# Patient Record
Sex: Male | Born: 1990 | Race: Asian | Hispanic: No | Marital: Single | Smoking: Former smoker
Health system: Southern US, Community
[De-identification: ages and names within clinical notes are randomized; demographics above are authoritative.]

## PROBLEM LIST (undated history)

## (undated) DIAGNOSIS — F1910 Other psychoactive substance abuse, uncomplicated: Secondary | ICD-10-CM

## (undated) DIAGNOSIS — I10 Essential (primary) hypertension: Secondary | ICD-10-CM

## (undated) DIAGNOSIS — Z87442 Personal history of urinary calculi: Secondary | ICD-10-CM

## (undated) DIAGNOSIS — F419 Anxiety disorder, unspecified: Secondary | ICD-10-CM

## (undated) DIAGNOSIS — F32A Depression, unspecified: Secondary | ICD-10-CM

## (undated) HISTORY — PX: TUMOR EXCISION: SHX421

## (undated) HISTORY — DX: Essential (primary) hypertension: I10

## (undated) HISTORY — DX: Depression, unspecified: F32.A

## (undated) HISTORY — DX: Anxiety disorder, unspecified: F41.9

## (undated) HISTORY — DX: Other psychoactive substance abuse, uncomplicated: F19.10

## (undated) HISTORY — DX: Personal history of urinary calculi: Z87.442

---
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
## 2019-07-19 ENCOUNTER — Encounter: Payer: Self-pay | Admitting: Emergency Medicine

## 2019-07-19 ENCOUNTER — Emergency Department: Payer: BC Managed Care – PPO

## 2019-07-19 ENCOUNTER — Emergency Department
Admission: EM | Admit: 2019-07-19 | Discharge: 2019-07-19 | Disposition: A | Discharge status: Home / Self Care | Payer: BC Managed Care – PPO | Attending: Emergency Medicine | Admitting: Emergency Medicine

## 2019-07-19 ENCOUNTER — Other Ambulatory Visit: Payer: Self-pay

## 2019-07-19 DIAGNOSIS — N130 Hydronephrosis with ureteropelvic junction obstruction: Secondary | ICD-10-CM | POA: Insufficient documentation

## 2019-07-19 DIAGNOSIS — N200 Calculus of kidney: Secondary | ICD-10-CM | POA: Diagnosis not present

## 2019-07-19 DIAGNOSIS — F17210 Nicotine dependence, cigarettes, uncomplicated: Secondary | ICD-10-CM | POA: Diagnosis not present

## 2019-07-19 DIAGNOSIS — R1032 Left lower quadrant pain: Secondary | ICD-10-CM | POA: Diagnosis not present

## 2019-07-19 LAB — COMPREHENSIVE METABOLIC PANEL
ALT: 15 U/L (ref 0–44)
AST: 25 U/L (ref 15–41)
Albumin: 4.7 g/dL (ref 3.5–5.0)
Alkaline Phosphatase: 81 U/L (ref 38–126)
Anion gap: 15 (ref 5–15)
BUN: 13 mg/dL (ref 6–20)
CO2: 21 mmol/L — ABNORMAL LOW (ref 22–32)
Calcium: 9.7 mg/dL (ref 8.9–10.3)
Chloride: 105 mmol/L (ref 98–111)
Creatinine, Ser: 1.43 mg/dL — ABNORMAL HIGH (ref 0.61–1.24)
GFR calc Af Amer: >60 mL/min (ref >60)
GFR calc non Af Amer: >60 mL/min (ref >60)
Glucose, Bld: 118 mg/dL — ABNORMAL HIGH (ref 70–99)
Potassium: 3.8 mmol/L (ref 3.5–5.1)
Sodium: 141 mmol/L (ref 135–145)
Total Bilirubin: 0.5 mg/dL (ref 0.3–1.2)
Total Protein: 8.4 g/dL — ABNORMAL HIGH (ref 6.5–8.1)

## 2019-07-19 LAB — CBC
HCT: 44.8 % (ref 39.0–52.0)
Hemoglobin: 14.7 g/dL (ref 13.0–17.0)
MCH: 24.9 pg — ABNORMAL LOW (ref 26.0–34.0)
MCHC: 32.8 g/dL (ref 30.0–36.0)
MCV: 75.8 fL — ABNORMAL LOW (ref 80.0–100.0)
Platelets: 379 10*3/uL (ref 150–400)
RBC: 5.91 MIL/uL — ABNORMAL HIGH (ref 4.22–5.81)
RDW: 12.6 % (ref 11.5–15.5)
WBC: 17.9 10*3/uL — ABNORMAL HIGH (ref 4.0–10.5)
nRBC: 0 % (ref 0.0–0.2)

## 2019-07-19 LAB — URINALYSIS, COMPLETE (UACMP) WITH MICROSCOPIC
Bacteria, UA: NONE SEEN
Bilirubin Urine: NEGATIVE
Glucose, UA: NEGATIVE mg/dL
Ketones, ur: 5 mg/dL — AB
Leukocytes,Ua: NEGATIVE
Nitrite: NEGATIVE
Protein, ur: 30 mg/dL — AB
RBC / HPF: >50 RBC/hpf — ABNORMAL HIGH (ref 0–5)
Specific Gravity, Urine: 1.024 (ref 1.005–1.030)
Squamous Epithelial / HPF: NONE SEEN (ref 0–5)
pH: 7 (ref 5.0–8.0)

## 2019-07-19 LAB — LIPASE, BLOOD: Lipase: 34 U/L (ref 11–51)

## 2019-07-19 MED ORDER — OXYCODONE-ACETAMINOPHEN 5-325 MG PO TABS
1.0000 | ORAL_TABLET | Freq: Once | ORAL | Status: AC
  Administered 2019-07-19: 1 via ORAL
  Filled 2019-07-19: qty 1

## 2019-07-19 MED ORDER — IBUPROFEN 600 MG PO TABS: 600.0000 mg | ORAL_TABLET | Freq: Four times a day (QID) | ORAL | 0 refills | Status: DC | PRN

## 2019-07-19 MED ORDER — KETOROLAC TROMETHAMINE 30 MG/ML IJ SOLN
30.0000 mg | Freq: Once | INTRAMUSCULAR | Status: AC
  Administered 2019-07-19: 30 mg via INTRAVENOUS
  Filled 2019-07-19: qty 1

## 2019-07-19 MED ORDER — ONDANSETRON HCL 4 MG/2ML IJ SOLN
4.0000 mg | Freq: Once | INTRAMUSCULAR | Status: AC | PRN
  Administered 2019-07-19: 4 mg via INTRAVENOUS
  Filled 2019-07-19: qty 2

## 2019-07-19 MED ORDER — FENTANYL CITRATE (PF) 100 MCG/2ML IJ SOLN
50.0000 ug | INTRAMUSCULAR | Status: AC | PRN
  Administered 2019-07-19 (×2): 50 ug via INTRAVENOUS
  Filled 2019-07-19 (×2): qty 2

## 2019-07-19 MED ORDER — OXYCODONE-ACETAMINOPHEN 5-325 MG PO TABS: 1.0000 | ORAL_TABLET | ORAL | 0 refills | Status: DC | PRN

## 2019-07-19 MED ORDER — SODIUM CHLORIDE 0.9 % IV BOLUS
1000.0000 mL | Freq: Once | INTRAVENOUS | Status: AC
  Administered 2019-07-19: 1000 mL via INTRAVENOUS

## 2019-07-19 MED ORDER — MORPHINE SULFATE (PF) 4 MG/ML IV SOLN
4.0000 mg | Freq: Once | INTRAVENOUS | Status: AC
  Administered 2019-07-19: 16:00:00 4 mg via INTRAVENOUS
  Filled 2019-07-19: qty 1

## 2019-07-19 MED ORDER — OXYCODONE-ACETAMINOPHEN 5-325 MG PO TABS: 1.0000 | ORAL_TABLET | ORAL | 0 refills | Status: AC | PRN

## 2019-07-19 NOTE — ED Provider Notes (Signed)
[facility] Emergency Department Provider Note ____________________________________________   First MD Initiated Contact with Patient 07/19/19 1541     (approximate)  I have reviewed the triage vital signs and the nursing notes.   HISTORY  Chief Complaint Abdominal Pain and Flank Pain    HPI Jeremiah Cameron is a 28 y.o. male with no significant PMH who presents with left flank pain, acute onset this morning, severe intensity, and radiating to the left lower abdomen.  He denies any nausea or vomiting.  He has not had any similar pain in the past.  He has no fever or diarrhea.  He denies any urinary symptoms.  History reviewed. No pertinent past medical history.  There are no active problems to display for this patient.   History reviewed. No pertinent surgical history.  Prior to Admission medications   Medication Sig Start Date End Date Taking? Authorizing Provider  ibuprofen (ADVIL) 600 MG tablet Take 1 tablet (600 mg total) by mouth every 6 (six) hours as needed. 07/19/19   Arta Silence, MD  oxyCODONE-acetaminophen (PERCOCET) 5-325 MG tablet Take 1 tablet by mouth every 4 (four) hours as needed for up to 3 days for severe pain. 07/19/19 07/22/19  Arta Silence, MD    Allergies Patient has no known allergies.  No family history on file.  Social History Social History   Tobacco Use  . Smoking status: Current Every Day Smoker    Packs/day: 0.50    Types: Cigarettes  . Smokeless tobacco: Never Used  Substance Use Topics  . Alcohol use: Not on file  . Drug use: Not on file    Review of Systems  Constitutional: No fever. Eyes: No redness. ENT: No sore throat. Cardiovascular: Denies chest pain. Respiratory: Denies shortness of breath. Gastrointestinal: No vomiting. Genitourinary: Negative for dysuria.  Positive for flank pain. Musculoskeletal: Negative for back pain. Skin: Negative for rash. Neurological: Negative for  headache.   ____________________________________________   PHYSICAL EXAM:  VITAL SIGNS: ED Triage Vitals  Enc Vitals Group     BP 07/19/19 1217 (!) 138/48     Pulse Rate 07/19/19 1217 75     Resp 07/19/19 1217 18     Temp 07/19/19 1217 97.9 F (36.6 C)     Temp Source 07/19/19 1217 Oral     SpO2 07/19/19 1217 100 %     Weight 07/19/19 1218 230 lb (104.3 kg)     Height 07/19/19 1218 6' (1.829 m)     Head Circumference --      Peak Flow --      Pain Score 07/19/19 1219 10     Pain Loc --      Pain Edu? --      Excl. in [place]? --     Constitutional: Alert and oriented.  Uncomfortable but not acutely ill-appearing; in no acute distress. Eyes: Conjunctivae are normal.  Head: Atraumatic. Nose: No congestion/rhinnorhea. Mouth/Throat: Mucous membranes are moist.   Neck: Normal range of motion.  Cardiovascular: Good peripheral circulation. Respiratory: Normal respiratory effort.  No retractions.  Gastrointestinal: Soft and nontender. No distention.  Genitourinary: No flank tenderness. Musculoskeletal: Extremities warm and well perfused.  Neurologic:  Normal speech and language. No gross focal neurologic deficits are appreciated.  Skin:  Skin is warm and dry. No rash noted. Psychiatric: Mood and affect are normal. Speech and behavior are normal.  ____________________________________________   LABS (all labs ordered are listed, but only abnormal results are displayed)  Labs Reviewed  COMPREHENSIVE METABOLIC PANEL - Abnormal; Notable for the following components:      Result Value   CO2 21 (*)    Glucose, Bld 118 (*)    Creatinine, Ser 1.43 (*)    Total Protein 8.4 (*)    All other components within normal limits  CBC - Abnormal; Notable for the following components:   WBC 17.9 (*)    RBC 5.91 (*)    MCV 75.8 (*)    MCH 24.9 (*)    All other components within normal limits  URINALYSIS, COMPLETE (UACMP) WITH MICROSCOPIC - Abnormal; Notable for the following components:    Color, Urine YELLOW (*)    APPearance CLEAR (*)    Hgb urine dipstick MODERATE (*)    Ketones, ur 5 (*)    Protein, ur 30 (*)    RBC / HPF >50 (*)    All other components within normal limits  LIPASE, BLOOD   ____________________________________________  EKG   ____________________________________________  RADIOLOGY  CT abdomen: 4 mm ureteral stone at the left UVJ, with mild to moderate hydronephrosis.  Possible mild colitis of distal colon versus collapsed appearance.  ____________________________________________   PROCEDURES  Procedure(s) performed: No  Procedures  Critical Care performed: No ____________________________________________   INITIAL IMPRESSION / ASSESSMENT AND PLAN / ED COURSE  Pertinent labs & imaging results that were available during my care of the patient were reviewed by me and considered in my medical decision making (see chart for details).  28 year old male with no significant PMH presents with acute onset of left flank pain today with no vomiting, fever, or significant urinary symptoms.  On exam, patient is uncomfortable but overall well appearing.  His vital signs are normal.  He does not have any significant abdominal or flank tenderness.  The remainder of the exam is unremarkable.  Overall presentation is consistent with ureteral stone.  CT shows a 4 mm stone at the left UVJ with mild to moderate hydronephrosis.  There is also a collapsed or possibly mildly inflamed appearance of the distal colon, and the patient does have an elevated WBC count.  However, he has no other symptoms to suggest colitis given the acute onset of the pain and the lack of GI symptoms.  I suspect that the WBC count is more of a stress reaction than representing any type of infectious process.  ----------------------------------------- 5:23 PM on 07/19/2019 -----------------------------------------  The patient's pain is well controlled after morphine and Toradol.   He has received a liter of fluids.  She is feeling much better and is comfortable to go home.  He states the pain started to come back a little bit but is much less severe.  I will give a dose of Percocet prior to discharge.  I discussed the CT results with the patient.  Given that he has no GI symptoms and no clinical evidence of colitis, we will not start antibiotics or any specific treatment for this.  However, he is aware to return if he starts to develop diarrhea, fever, or other GI symptoms.  I gave him return precautions for kidney stone as well, and he expressed understanding.  ____________________________________________   FINAL CLINICAL IMPRESSION(S) / ED DIAGNOSES  Final diagnoses:  Kidney stone      NEW MEDICATIONS STARTED DURING THIS VISIT:  New Prescriptions   IBUPROFEN (ADVIL) 600 MG TABLET    Take 1 tablet (600 mg total) by mouth every 6 (six) hours as needed.   OXYCODONE-ACETAMINOPHEN (PERCOCET) 5-325 MG TABLET  Take 1 tablet by mouth every 4 (four) hours as needed for up to 3 days for severe pain.     Note:  This document was prepared using Dragon voice recognition software and may include unintentional dictation errors.    Dionne Bucy, MD 07/19/19 1724

## 2019-07-19 NOTE — Discharge Instructions (Addendum)
Take the ibuprofen as prescribed up to every 6 hours, and the Percocet only if needed for more severe pain.  Return to the ER for new, worsening, or persistent severe left-sided pain, fever, vomiting, diarrhea, blood in the stool, weakness, or any other new or worsening symptoms that concern you.

## 2019-07-19 NOTE — ED Triage Notes (Signed)
Pt arrived via POV with reports of left flank pain and left lower abdominal pain that started this morning. On arrival to ED pt is diaphoretic and writhing around in wheelchair.  Pt denies any N/V. Denies hx of kidney stones, denies any dysuria or difficulty urinating.

## 2019-07-19 NOTE — ED Notes (Signed)
MD at bedside. 

## 2019-07-21 ENCOUNTER — Emergency Department
Admission: EM | Admit: 2019-07-21 | Discharge: 2019-07-21 | Disposition: A | Discharge status: Home / Self Care | Payer: BC Managed Care – PPO | Attending: Emergency Medicine | Admitting: Emergency Medicine

## 2019-07-21 ENCOUNTER — Other Ambulatory Visit: Payer: Self-pay

## 2019-07-21 ENCOUNTER — Encounter: Payer: Self-pay | Admitting: Emergency Medicine

## 2019-07-21 DIAGNOSIS — N201 Calculus of ureter: Secondary | ICD-10-CM | POA: Diagnosis not present

## 2019-07-21 DIAGNOSIS — N200 Calculus of kidney: Secondary | ICD-10-CM | POA: Diagnosis not present

## 2019-07-21 DIAGNOSIS — F17210 Nicotine dependence, cigarettes, uncomplicated: Secondary | ICD-10-CM | POA: Insufficient documentation

## 2019-07-21 DIAGNOSIS — Z79899 Other long term (current) drug therapy: Secondary | ICD-10-CM | POA: Insufficient documentation

## 2019-07-21 DIAGNOSIS — R109 Unspecified abdominal pain: Secondary | ICD-10-CM | POA: Diagnosis not present

## 2019-07-21 LAB — URINALYSIS, COMPLETE (UACMP) WITH MICROSCOPIC
Bacteria, UA: NONE SEEN
Bilirubin Urine: NEGATIVE
Glucose, UA: NEGATIVE mg/dL
Ketones, ur: NEGATIVE mg/dL
Leukocytes,Ua: NEGATIVE
Nitrite: NEGATIVE
Protein, ur: NEGATIVE mg/dL
RBC / HPF: >50 RBC/hpf — ABNORMAL HIGH (ref 0–5)
Specific Gravity, Urine: 1.015 (ref 1.005–1.030)
Squamous Epithelial / HPF: NONE SEEN (ref 0–5)
pH: 5 (ref 5.0–8.0)

## 2019-07-21 LAB — CBC
HCT: 42.5 % (ref 39.0–52.0)
Hemoglobin: 14.1 g/dL (ref 13.0–17.0)
MCH: 25.3 pg — ABNORMAL LOW (ref 26.0–34.0)
MCHC: 33.2 g/dL (ref 30.0–36.0)
MCV: 76.3 fL — ABNORMAL LOW (ref 80.0–100.0)
Platelets: 336 10*3/uL (ref 150–400)
RBC: 5.57 MIL/uL (ref 4.22–5.81)
RDW: 12.6 % (ref 11.5–15.5)
WBC: 9.4 10*3/uL (ref 4.0–10.5)
nRBC: 0 % (ref 0.0–0.2)

## 2019-07-21 LAB — COMPREHENSIVE METABOLIC PANEL
ALT: 12 U/L (ref 0–44)
AST: 18 U/L (ref 15–41)
Albumin: 4.4 g/dL (ref 3.5–5.0)
Alkaline Phosphatase: 72 U/L (ref 38–126)
Anion gap: 10 (ref 5–15)
BUN: 11 mg/dL (ref 6–20)
CO2: 25 mmol/L (ref 22–32)
Calcium: 9.1 mg/dL (ref 8.9–10.3)
Chloride: 104 mmol/L (ref 98–111)
Creatinine, Ser: 1.22 mg/dL (ref 0.61–1.24)
GFR calc Af Amer: >60 mL/min (ref >60)
GFR calc non Af Amer: >60 mL/min (ref >60)
Glucose, Bld: 99 mg/dL (ref 70–99)
Potassium: 3.7 mmol/L (ref 3.5–5.1)
Sodium: 139 mmol/L (ref 135–145)
Total Bilirubin: 0.7 mg/dL (ref 0.3–1.2)
Total Protein: 8.1 g/dL (ref 6.5–8.1)

## 2019-07-21 LAB — LIPASE, BLOOD: Lipase: 35 U/L (ref 11–51)

## 2019-07-21 MED ORDER — TAMSULOSIN HCL 0.4 MG PO CAPS: 0.4000 mg | ORAL_CAPSULE | Freq: Every day | ORAL | 0 refills | Status: DC

## 2019-07-21 NOTE — ED Notes (Signed)
Pt ambulatory from triage in NAD, pt states he has had no BM since before 11/15 when he was discharged from ER for kidney stones. Pt was told to come back if he has not passed his stones and needs a work note as well. 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

## 2019-07-21 NOTE — ED Triage Notes (Signed)
Pt states dx with kidney stones x3days ago and still hasn't passed. PT states he was told to come back in if trouble with bowel movements. States has not had BM x3days ago. NAD noted

## 2019-07-21 NOTE — ED Provider Notes (Signed)
[facility] Emergency Department Provider Note  Time seen: 5:01 PM  I have reviewed the triage vital signs and the nursing notes.   HISTORY  Chief Complaint Kidney stones  HPI Jeremiah Cameron is a 28 y.o. male with no significant past medical history presents to the emergency department for continued flank pain after being diagnosed with a kidney stone 2 days ago.  According to the patient he had significant flank pain on Sunday came to the ED and was diagnosed with a kidney stone on CT scan.  Patient states since going home he has been feeling better until tonight when he had onset of sharp pain once again.  He states however since coming to the emergency department the pain has since gone away.  Currently rates his discomfort as a 0/10.  States earlier it was moderate to severe sharp pain.   History reviewed. No pertinent past medical history.  There are no active problems to display for this patient.   History reviewed. No pertinent surgical history.  Prior to Admission medications   Medication Sig Start Date End Date Taking? Authorizing Provider  ibuprofen (ADVIL) 600 MG tablet Take 1 tablet (600 mg total) by mouth every 6 (six) hours as needed. 07/19/19   Dionne Bucy, MD  oxyCODONE-acetaminophen (PERCOCET) 5-325 MG tablet Take 1 tablet by mouth every 4 (four) hours as needed for up to 3 days for severe pain. 07/19/19 07/22/19  Dionne Bucy, MD    No Known Allergies  No family history on file.  Social History Social History   Tobacco Use  . Smoking status: Current Every Day Smoker    Packs/day: 0.50    Types: Cigarettes  . Smokeless tobacco: Never Used  Substance Use Topics  . Alcohol use: Not on file  . Drug use: Not on file    Review of Systems Constitutional: Negative for fever. Cardiovascular: Negative for chest pain. Respiratory: Negative for shortness of breath. Gastrointestinal: Flank pain. Genitourinary: States  hematuria 2 days ago none since. Musculoskeletal: Negative for musculoskeletal complaints Neurological: Negative for headache All other ROS negative  ____________________________________________   PHYSICAL EXAM:  VITAL SIGNS: ED Triage Vitals  Enc Vitals Group     BP 07/21/19 1513 (!) 135/95     Pulse Rate 07/21/19 1513 83     Resp 07/21/19 1513 16     Temp 07/21/19 1513 98.7 F (37.1 C)     Temp Source 07/21/19 1513 Oral     SpO2 07/21/19 1513 99 %     Weight --      Height --      Head Circumference --      Peak Flow --      Pain Score 07/21/19 1507 3     Pain Loc --      Pain Edu? --      Excl. in [place]? --    Constitutional: Alert and oriented. Well appearing and in no distress. Eyes: Normal exam ENT      Head: Normocephalic and atraumatic.      Mouth/Throat: Mucous membranes are moist. Cardiovascular: Normal rate, regular rhythm.  Respiratory: Normal respiratory effort without tachypnea nor retractions. Breath sounds are clear Gastrointestinal: Soft and nontender. No distention.  Musculoskeletal: Nontender with normal range of motion in all extremities.  Neurologic:  Normal speech and language. No gross focal neurologic deficits Skin:  Skin is warm, dry and intact.  Psychiatric: Mood and affect are normal.     INITIAL IMPRESSION / ASSESSMENT  AND PLAN / ED COURSE  Pertinent labs & imaging results that were available during my care of the patient were reviewed by me and considered in my medical decision making (see chart for details).   Patient presents to the emergency department for continued flank pain earlier today which has since resolved.  I reviewed the patient's CT scan from 2 days ago he has a 4 mm left UVJ stone at that time.  Performed a bedside ultrasound did not see any stone within the bladder at this time.  Patient states sharp onset of pain earlier today, this could very likely indicate stone movement.  Patient was discharged on Percocet and ibuprofen.   Has been taking ibuprofen but has not taken Percocet.  I discussed with the patient a trial of Flomax as well to promote stone passage I also discussed drinking plenty of fluids and using ibuprofen as needed and Percocet if needed for severe pain.  Patient agreeable to plan of care.  We will discharge with urine strainer as well.  Jeremiah Cameron was evaluated in Emergency Department on 07/21/2019 for the symptoms described in the history of present illness. He was evaluated in the context of the global COVID-19 pandemic, which necessitated consideration that the patient might be at risk for infection with the SARS-CoV-2 virus that causes COVID-19. Institutional protocols and algorithms that pertain to the evaluation of patients at risk for COVID-19 are in a state of rapid change based on information released by regulatory bodies including the CDC and federal and state organizations. These policies and algorithms were followed during the patient's care in the ED.  ____________________________________________   FINAL CLINICAL IMPRESSION(S) / ED DIAGNOSES  Kidney stone    Harvest Dark, MD 07/21/19 1704

## 2020-07-24 ENCOUNTER — Emergency Department
Admission: EM | Admit: 2020-07-24 | Discharge: 2020-07-25 | Disposition: A | Discharge status: Home / Self Care | Payer: BC Managed Care – PPO | Attending: Emergency Medicine | Admitting: Emergency Medicine

## 2020-07-24 ENCOUNTER — Emergency Department: Payer: BC Managed Care – PPO

## 2020-07-24 ENCOUNTER — Encounter: Payer: Self-pay | Admitting: Emergency Medicine

## 2020-07-24 ENCOUNTER — Other Ambulatory Visit: Payer: Self-pay

## 2020-07-24 DIAGNOSIS — F1910 Other psychoactive substance abuse, uncomplicated: Secondary | ICD-10-CM | POA: Diagnosis not present

## 2020-07-24 DIAGNOSIS — F1510 Other stimulant abuse, uncomplicated: Secondary | ICD-10-CM | POA: Diagnosis not present

## 2020-07-24 DIAGNOSIS — E876 Hypokalemia: Secondary | ICD-10-CM | POA: Insufficient documentation

## 2020-07-24 DIAGNOSIS — F17210 Nicotine dependence, cigarettes, uncomplicated: Secondary | ICD-10-CM | POA: Insufficient documentation

## 2020-07-24 DIAGNOSIS — R0602 Shortness of breath: Secondary | ICD-10-CM | POA: Insufficient documentation

## 2020-07-24 DIAGNOSIS — R252 Cramp and spasm: Secondary | ICD-10-CM | POA: Insufficient documentation

## 2020-07-24 DIAGNOSIS — R079 Chest pain, unspecified: Secondary | ICD-10-CM | POA: Diagnosis not present

## 2020-07-24 DIAGNOSIS — R000 Tachycardia, unspecified: Secondary | ICD-10-CM | POA: Diagnosis not present

## 2020-07-24 DIAGNOSIS — R002 Palpitations: Secondary | ICD-10-CM | POA: Diagnosis not present

## 2020-07-24 LAB — CBC
HCT: 44.6 % (ref 39.0–52.0)
Hemoglobin: 15.2 g/dL (ref 13.0–17.0)
MCH: 25.5 pg — ABNORMAL LOW (ref 26.0–34.0)
MCHC: 34.1 g/dL (ref 30.0–36.0)
MCV: 75 fL — ABNORMAL LOW (ref 80.0–100.0)
Platelets: 408 10*3/uL — ABNORMAL HIGH (ref 150–400)
RBC: 5.95 MIL/uL — ABNORMAL HIGH (ref 4.22–5.81)
RDW: 12.6 % (ref 11.5–15.5)
WBC: 15.9 10*3/uL — ABNORMAL HIGH (ref 4.0–10.5)
nRBC: 0 % (ref 0.0–0.2)

## 2020-07-24 LAB — BASIC METABOLIC PANEL
Anion gap: 16 — ABNORMAL HIGH (ref 5–15)
BUN: 10 mg/dL (ref 6–20)
CO2: 22 mmol/L (ref 22–32)
Calcium: 9 mg/dL (ref 8.9–10.3)
Chloride: 95 mmol/L — ABNORMAL LOW (ref 98–111)
Creatinine, Ser: 1.3 mg/dL — ABNORMAL HIGH (ref 0.61–1.24)
GFR, Estimated: >60 mL/min (ref >60)
Glucose, Bld: 105 mg/dL — ABNORMAL HIGH (ref 70–99)
Potassium: 2.8 mmol/L — ABNORMAL LOW (ref 3.5–5.1)
Sodium: 133 mmol/L — ABNORMAL LOW (ref 135–145)

## 2020-07-24 LAB — TROPONIN I (HIGH SENSITIVITY)
Troponin I (High Sensitivity): 6 ng/L (ref <18)
Troponin I (High Sensitivity): 7 ng/L (ref <18)

## 2020-07-24 MED ORDER — LORAZEPAM 1 MG PO TABS
1.0000 mg | ORAL_TABLET | Freq: Once | ORAL | Status: AC
  Administered 2020-07-24: 1 mg via ORAL
  Filled 2020-07-24: qty 1

## 2020-07-24 MED ORDER — SODIUM CHLORIDE 0.9 % IV BOLUS
1000.0000 mL | Freq: Once | INTRAVENOUS | Status: AC
  Administered 2020-07-24: 1000 mL via INTRAVENOUS

## 2020-07-24 MED ORDER — POTASSIUM CHLORIDE CRYS ER 20 MEQ PO TBCR
60.0000 meq | EXTENDED_RELEASE_TABLET | Freq: Once | ORAL | Status: AC
  Administered 2020-07-24: 60 meq via ORAL
  Filled 2020-07-24: qty 3

## 2020-07-24 MED ORDER — LORAZEPAM 2 MG/ML IJ SOLN
1.0000 mg | Freq: Once | INTRAMUSCULAR | Status: AC
  Administered 2020-07-24: 1 mg via INTRAVENOUS
  Filled 2020-07-24: qty 1

## 2020-07-24 NOTE — ED Notes (Signed)
Assumed care of pt upon being roomed. Denies sob or cp at this time. Reports intermittent "chest racing" and accompanying sob. IV access obtained. AO x4, talking in full sentences with regular and unlabored breathing. Side rails up x2, call bell within reach

## 2020-07-24 NOTE — ED Triage Notes (Signed)
Patient states that he took ecstasy yesterday. Patient states that he has not been feeling right since. Patient states that he has taken ecstasy in the past but it did not make him feel this way. Patient with complaint of shortness of breath and hands cramping.

## 2020-07-24 NOTE — ED Provider Notes (Signed)
[facility] Emergency Department Provider Note   ____________________________________________   First MD Initiated Contact with Patient 07/24/20 2313     (approximate)  I have reviewed the triage vital signs and the nursing notes.   HISTORY  Chief Complaint Shortness of Breath    HPI Jeremiah Cameron is a 29 y.o. male who presents to the ED from home with a chief complaint of shortness of breath, palpitations and hands cramping.  Patient admits to using ecstasy and cocaine yesterday.  States he has not been feeling right with the above complaints since.  Denies fever, cough, chest pain, abdominal pain, nausea, vomiting or dizziness.     Past medical history None  There are no problems to display for this patient.   Past Surgical History:  Procedure Laterality Date  . TUMOR EXCISION      Prior to Admission medications   Not on File    Allergies Fish allergy  No family history on file.  Social History Social History   Tobacco Use  . Smoking status: Current Every Day Smoker    Packs/day: 0.50    Types: Cigarettes  . Smokeless tobacco: Never Used  Vaping Use  . Vaping Use: Never used  Substance Use Topics  . Alcohol use: Yes  . Drug use: Yes    Review of Systems  Constitutional: No fever/chills Eyes: No visual changes. ENT: No sore throat. Cardiovascular: Denies chest pain. Respiratory: Positive for shortness of breath. Gastrointestinal: No abdominal pain.  No nausea, no vomiting.  No diarrhea.  No constipation. Genitourinary: Negative for dysuria. Musculoskeletal: Positive for bilateral hands cramping.  Negative for back pain. Skin: Negative for rash. Neurological: Negative for headaches, focal weakness or numbness.   ____________________________________________   PHYSICAL EXAM:  VITAL SIGNS: ED Triage Vitals  Enc Vitals Group     BP 07/24/20 2026 (!) 149/93     Pulse Rate 07/24/20 2026 (!) 129     Resp  07/24/20 2026 (!) 22     Temp 07/24/20 2026 99.1 F (37.3 C)     Temp Source 07/24/20 2026 Oral     SpO2 07/24/20 2026 100 %     Weight 07/24/20 2028 210 lb (95.3 kg)     Height 07/24/20 2028 6' (1.829 m)     Head Circumference --      Peak Flow --      Pain Score 07/24/20 2028 7     Pain Loc --      Pain Edu? --      Excl. in [place]? --     Constitutional: Alert and oriented. Well appearing and in no acute distress. Eyes: Conjunctivae are normal. PERRL. EOMI. Head: Atraumatic. Nose: No congestion/rhinnorhea. Mouth/Throat: Mucous membranes are moist.   Neck: No stridor.   Cardiovascular: Normal rate, regular rhythm. Grossly normal heart sounds.  Good peripheral circulation. Respiratory: Normal respiratory effort.  No retractions. Lungs CTAB. Gastrointestinal: Soft and nontender. No distention. No abdominal bruits. No CVA tenderness. Musculoskeletal: No lower extremity tenderness nor edema.  No joint effusions. Neurologic:  Normal speech and language. No gross focal neurologic deficits are appreciated. No gait instability. Skin:  Skin is warm, dry and intact. No rash noted. Psychiatric: Mood and affect are normal. Speech and behavior are normal.  ____________________________________________   LABS (all labs ordered are listed, but only abnormal results are displayed)  Labs Reviewed  BASIC METABOLIC PANEL - Abnormal; Notable for the following components:      Result Value  Sodium 133 (*)    Potassium 2.8 (*)    Chloride 95 (*)    Glucose, Bld 105 (*)    Creatinine, Ser 1.30 (*)    Anion gap 16 (*)    All other components within normal limits  CBC - Abnormal; Notable for the following components:   WBC 15.9 (*)    RBC 5.95 (*)    MCV 75.0 (*)    MCH 25.5 (*)    Platelets 408 (*)    All other components within normal limits  TROPONIN I (HIGH SENSITIVITY)  TROPONIN I (HIGH SENSITIVITY)   ____________________________________________  EKG  ED ECG REPORT I, Bobetta Korf  J, the attending physician, personally viewed and interpreted this ECG.   Date: 07/24/2020  EKG Time: 2027  Rate: 134  Rhythm: sinus tachycardia  Axis: Normal  Intervals:none  ST&T Change: Nonspecific  ____________________________________________  RADIOLOGY I, Sheretha Shadd J, personally viewed and evaluated these images (plain radiographs) as part of my medical decision making, as well as reviewing the written report by the radiologist.  ED MD interpretation: No acute cardiopulmonary process  Official radiology report(s): DG Chest 2 View  Result Date: 07/24/2020 CLINICAL DATA:  Chest pain EXAM: CHEST - 2 VIEW COMPARISON:  None. FINDINGS: The heart size and mediastinal contours are within normal limits. Both lungs are clear. The visualized skeletal structures are unremarkable. IMPRESSION: No active cardiopulmonary disease. Electronically Signed   By: Jonna Clark M.D.   On: 07/24/2020 21:29    ____________________________________________   PROCEDURES  Procedure(s) performed (including Critical Care):  .1-3 Lead EKG Interpretation Performed by: Irean Hong, MD Authorized by: Irean Hong, MD     Interpretation: abnormal     ECG rate:  107   ECG rate assessment: tachycardic     Rhythm: sinus tachycardia     Ectopy: none     Conduction: normal   Comments:     Patient placed on cardiac monitor to evaluate for arrhythmias     ____________________________________________   INITIAL IMPRESSION / ASSESSMENT AND PLAN / ED COURSE  As part of my medical decision making, I reviewed the following data within the electronic MEDICAL RECORD NUMBER Nursing notes reviewed and incorporated, Labs reviewed, EKG interpreted, Old chart reviewed (no office visits), Radiograph reviewed and Notes from prior ED visits (visits for kidney stone)     29 year old male presenting with shortness of breath, palpitations and hands cramping after using cocaine and ecstasy yesterday recreationally.  Differential includes, but is not limited to, viral syndrome, bronchitis including COPD exacerbation, pneumonia, reactive airway disease including asthma, CHF including exacerbation with or without pulmonary/interstitial edema, pneumothorax, ACS, thoracic trauma, and pulmonary embolism.  Laboratory results demonstrate mild leukocytosis, mild AKI, hypokalemia, 2 sets of negative troponins, normal chest x-ray.  At the time of my exam and interview, patient's pulse rate is 105, BP 128/88 and he is feeling significantly better without intervention.  Will initiate IV fluid resuscitation, replete potassium, IV Ativan and reassess.   Clinical Course as of Jul 25 630  Mon Jul 25, 2020  0045 Patient resting in no acute distress.  Heart rate 101, blood pressure normalized.  Strict return precautions given.  Patient verbalizes understanding agrees with plan of care.   [JS]    Clinical Course User Index [JS] Irean Hong, MD     ____________________________________________   FINAL CLINICAL IMPRESSION(S) / ED DIAGNOSES  Final diagnoses:  Shortness of breath  Hypokalemia  Substance abuse (HCC)  Stimulant abuse (HCC)  ED Discharge Orders    None      *Please note:  JULUS KELLEY was evaluated in Emergency Department on 07/25/2020 for the symptoms described in the history of present illness. He was evaluated in the context of the global COVID-19 pandemic, which necessitated consideration that the patient might be at risk for infection with the SARS-CoV-2 virus that causes COVID-19. Institutional protocols and algorithms that pertain to the evaluation of patients at risk for COVID-19 are in a state of rapid change based on information released by regulatory bodies including the CDC and federal and state organizations. These policies and algorithms were followed during the patient's care in the ED.  Some ED evaluations and interventions may be delayed as a result of limited staffing during  and the pandemic.*   Note:  This document was prepared using Dragon voice recognition software and may include unintentional dictation errors.   Irean Hong, MD 07/25/20 [phone]

## 2020-07-25 NOTE — Discharge Instructions (Addendum)
Stop using illicit drugs.  Drink plenty of fluids daily.  Return to the ER for worsening symptoms, persistent vomiting, difficulty breathing or other concerns. 

## 2020-07-26 ENCOUNTER — Other Ambulatory Visit: Payer: Self-pay

## 2020-07-26 ENCOUNTER — Emergency Department
Admission: EM | Admit: 2020-07-26 | Discharge: 2020-07-26 | Disposition: A | Discharge status: Home / Self Care | Payer: BC Managed Care – PPO | Attending: Emergency Medicine | Admitting: Emergency Medicine

## 2020-07-26 DIAGNOSIS — R252 Cramp and spasm: Secondary | ICD-10-CM | POA: Insufficient documentation

## 2020-07-26 DIAGNOSIS — R0600 Dyspnea, unspecified: Secondary | ICD-10-CM | POA: Insufficient documentation

## 2020-07-26 DIAGNOSIS — F17210 Nicotine dependence, cigarettes, uncomplicated: Secondary | ICD-10-CM | POA: Diagnosis not present

## 2020-07-26 DIAGNOSIS — R0602 Shortness of breath: Secondary | ICD-10-CM | POA: Diagnosis not present

## 2020-07-26 LAB — CBC WITH DIFFERENTIAL/PLATELET
Abs Immature Granulocytes: 0.05 10*3/uL (ref 0.00–0.07)
Basophils Absolute: 0.1 10*3/uL (ref 0.0–0.1)
Basophils Relative: 0 %
Eosinophils Absolute: 0.1 10*3/uL (ref 0.0–0.5)
Eosinophils Relative: 1 %
HCT: 43.2 % (ref 39.0–52.0)
Hemoglobin: 14.6 g/dL (ref 13.0–17.0)
Immature Granulocytes: 0 %
Lymphocytes Relative: 13 %
Lymphs Abs: 1.6 10*3/uL (ref 0.7–4.0)
MCH: 25.9 pg — ABNORMAL LOW (ref 26.0–34.0)
MCHC: 33.8 g/dL (ref 30.0–36.0)
MCV: 76.6 fL — ABNORMAL LOW (ref 80.0–100.0)
Monocytes Absolute: 0.7 10*3/uL (ref 0.1–1.0)
Monocytes Relative: 6 %
Neutro Abs: 10.1 10*3/uL — ABNORMAL HIGH (ref 1.7–7.7)
Neutrophils Relative %: 80 %
Platelets: 350 10*3/uL (ref 150–400)
RBC: 5.64 MIL/uL (ref 4.22–5.81)
RDW: 12.9 % (ref 11.5–15.5)
WBC: 12.6 10*3/uL — ABNORMAL HIGH (ref 4.0–10.5)
nRBC: 0.2 % (ref 0.0–0.2)

## 2020-07-26 LAB — BASIC METABOLIC PANEL
Anion gap: 10 (ref 5–15)
BUN: 11 mg/dL (ref 6–20)
CO2: 24 mmol/L (ref 22–32)
Calcium: 9.3 mg/dL (ref 8.9–10.3)
Chloride: 102 mmol/L (ref 98–111)
Creatinine, Ser: 1.04 mg/dL (ref 0.61–1.24)
GFR, Estimated: >60 mL/min (ref >60)
Glucose, Bld: 95 mg/dL (ref 70–99)
Potassium: 3.9 mmol/L (ref 3.5–5.1)
Sodium: 136 mmol/L (ref 135–145)

## 2020-07-26 MED ORDER — CYCLOBENZAPRINE HCL 10 MG PO TABS: 10.0000 mg | ORAL_TABLET | Freq: Three times a day (TID) | ORAL | 0 refills | Status: DC | PRN

## 2020-07-26 NOTE — ED Notes (Signed)
ED Provider at bedside. 

## 2020-07-26 NOTE — ED Notes (Signed)
Patient denies pain and is resting comfortably.  

## 2020-07-26 NOTE — ED Provider Notes (Signed)
[facility] Emergency Department Provider Note   ____________________________________________   First MD Initiated Contact with Patient 07/26/20 1020     (approximate)  I have reviewed the triage vital signs and the nursing notes.   HISTORY  Chief Complaint Shortness of Breath    HPI Jeremiah Cameron is a 29 y.o. male patient presents with shortness of breath for few days.  Patient state he woke up last night felt he could not breathe.  Patient also complain of cramping in bilateral lower extremity.  Patient was seen 2 days ago for same complaint at which time it was determined that he was hypokalemic.  Patient admitted prior to his arrival 2 days ago  heavy use of ecstasy and cocaine.  Patient denies fever, cough, chest pain, abdominal pain, nausea, vomiting, or vertigo.         No past medical history on file.  There are no problems to display for this patient.   Past Surgical History:  Procedure Laterality Date  . TUMOR EXCISION      Prior to Admission medications   Medication Sig Start Date End Date Taking? Authorizing Provider  cyclobenzaprine (FLEXERIL) 10 MG tablet Take 1 tablet (10 mg total) by mouth 3 (three) times daily as needed. 07/26/20   Joni Reining, PA-C    Allergies Fish allergy  No family history on file.  Social History Social History   Tobacco Use  . Smoking status: Current Every Day Smoker    Packs/day: 0.50    Types: Cigarettes  . Smokeless tobacco: Never Used  Vaping Use  . Vaping Use: Never used  Substance Use Topics  . Alcohol use: Yes  . Drug use: Yes    Review of Systems Constitutional: No fever/chills Eyes: No visual changes. ENT: No sore throat. Cardiovascular:  chest pain. Respiratory: Denies shortness of breath. Gastrointestinal: No abdominal pain.  No nausea, no vomiting.  No diarrhea.  No constipation. Genitourinary: Negative for dysuria. Musculoskeletal: Negative for back pain.   Bilateral leg cramps. Skin: Negative for rash. Neurological: Negative for headaches, focal weakness or numbness. Allergic/Immunilogical: Patient allergies. ____________________________________________   PHYSICAL EXAM:  VITAL SIGNS: ED Triage Vitals  Enc Vitals Group     BP 07/26/20 0954 (!) 127/91     Pulse Rate 07/26/20 0954 97     Resp 07/26/20 0954 (!) 22     Temp 07/26/20 0954 97.9 F (36.6 C)     Temp Source 07/26/20 0954 Oral     SpO2 07/26/20 0954 100 %     Weight 07/26/20 0955 210 lb (95.3 kg)     Height 07/26/20 0955 6' (1.829 m)     Head Circumference --      Peak Flow --      Pain Score 07/26/20 0955 4     Pain Loc --      Pain Edu? --      Excl. in GC? --     Constitutional: Alert and oriented. Well appearing and in no acute distress. Nose: No congestion/rhinnorhea. Mouth/Throat: Mucous membranes are moist.  Oropharynx non-erythematous. Neck: No stridor.   Cardiovascular: Normal rate, regular rhythm. Grossly normal heart sounds.  Good peripheral circulation. Respiratory: Normal respiratory effort.  No retractions. Lungs CTAB. Gastrointestinal: Soft and nontender. No distention. No abdominal bruits. No CVA tenderness. Musculoskeletal: No lower extremity tenderness nor edema.  No joint effusions. Neurologic:  Normal speech and language. No gross focal neurologic deficits are appreciated. No gait instability. Skin:  Skin  is warm, dry and intact. No rash noted. Psychiatric: Mood and affect are normal. Speech and behavior are normal.  ____________________________________________   LABS (all labs ordered are listed, but only abnormal results are displayed)  Labs Reviewed  CBC WITH DIFFERENTIAL/PLATELET - Abnormal; Notable for the following components:      Result Value   WBC 12.6 (*)    MCV 76.6 (*)    MCH 25.9 (*)    Neutro Abs 10.1 (*)    All other components within normal limits  BASIC METABOLIC PANEL    ____________________________________________  EKG   ____________________________________________  RADIOLOGY I, Joni Reining, personally viewed and evaluated these images (plain radiographs) as part of my medical decision making, as well as reviewing the written report by the radiologist.  ED MD interpretation:    Official radiology report(s): No results found.  ____________________________________________   PROCEDURES  Procedure(s) performed (including Critical Care):  Procedures   ____________________________________________   INITIAL IMPRESSION / ASSESSMENT AND PLAN / ED COURSE  As part of my medical decision making, I reviewed the following data within the electronic MEDICAL RECORD NUMBER         Patient presents with muscle cramps and intermittent dyspnea status post episode of taking ecstasy and cocaine.  Patient's chief complaint now is insomnia and muscle cramp to the lower extremities.  Discussed no acute findings on labs with patient.  Patient advised to increase water intake and was given a prescription for Flexeril.  [facility]  Return to ED if condition worsens.     ____________________________________________   FINAL CLINICAL IMPRESSION(S) / ED DIAGNOSES  Final diagnoses:  Muscle cramping     ED Discharge Orders         Ordered    cyclobenzaprine (FLEXERIL) 10 MG tablet  3 times daily PRN        07/26/20 1131          *Please note:  Jeremiah Cameron was evaluated in Emergency Department on 07/26/2020 for the symptoms described in the history of present illness. He was evaluated in the context of the global COVID-19 pandemic, which necessitated consideration that the patient might be at risk for infection with the SARS-CoV-2 virus that causes COVID-19. Institutional protocols and algorithms that pertain to the evaluation of patients at risk for COVID-19 are in a state of rapid change based on  information released by regulatory bodies including the CDC and federal and state organizations. These policies and algorithms were followed during the patient's care in the ED.  Some ED evaluations and interventions may be delayed as a result of limited staffing during and the pandemic.*   Note:  This document was prepared using Dragon voice recognition software and may include unintentional dictation errors.    Joni Reining, PA-C 07/26/20 1137    Shaune Pollack, MD 07/26/20 2109

## 2020-07-26 NOTE — ED Triage Notes (Signed)
intermittent shortness of breath for the past couple of days.  Reports last night he woke up feeling like he couldn't breathe.  Was seen here Sunday for same.

## 2020-07-26 NOTE — ED Notes (Signed)
Provided DC instructions. Patient verbalized understanding. Alert and ambulatory on DC.

## 2020-07-26 NOTE — Discharge Instructions (Signed)
Follow discharge care instruction take medication as directed. °

## 2020-07-27 ENCOUNTER — Ambulatory Visit: Payer: Self-pay

## 2020-07-27 NOTE — Telephone Encounter (Signed)
Patient called and says he is having panic attacks and was told by EMS last night to find a primary doctor to diagnose and treat. He says he has been having those panic feelings for about 3-4 days. He says he is waking up with overwhelming feelings of dread, he has muscle tightness, feeling lightheaded. He denies thoughts of suicide. Appointment scheduled for Monday, 08/01/20 at 1040 with Dortha Kern, PA-C, care advice given, patient verbalized understanding.  Reason for Disposition . [1] Symptoms of anxiety or panic AND [2] has not been evaluated for this by physician  Answer Assessment - Initial Assessment Questions 1. CONCERN: "What happened that made you call today?"     Panic attack last night 2. ANXIETY SYMPTOM SCREENING: "Can you describe how you have been feeling?"  (e.g., tense, restless, panicky, anxious, keyed up, trouble sleeping, trouble concentrating)     Trouble sleeping, overwhelming feeling of dread, lightheaded 3. ONSET: "How long have you been feeling this way?"     3-4 days 4. RECURRENT: "Have you felt this way before?"  If Yes, ask: "What happened that time?" "What helped these feelings go away in the past?"      No 5. RISK OF HARM - SUICIDAL IDEATION:  "Do you ever have thoughts of hurting or killing yourself?"  (e.g., yes, no, no but preoccupation with thoughts about death) No thoughts   - INTENT:  "Do you have thoughts of hurting or killing yourself right NOW?" (e.g., yes, no, N/A) No   - PLAN: "Do you have a specific plan for how you would do this?" (e.g., gun, knife, overdose, no plan, N/A)     No 6. RISK OF HARM - HOMICIDAL IDEATION:  "Do you ever have thoughts of hurting or killing someone else?"  (e.g., yes, no, no but preoccupation with thoughts about death) No   - INTENT:  "Do you have thoughts of hurting or killing someone right NOW?" (e.g., yes, no, N/A)   - PLAN: "Do you have a specific plan for how you would do this?" (e.g., gun, knife, no plan, N/A)       No 7. FUNCTIONAL IMPAIRMENT: "How have things been going for you overall? Have you had more difficulty than usual doing your normal daily activities?"  (e.g., better, same, worse; self-care, school, work, interactions)     Feel tired all the time, called out of work past couple of days 8. SUPPORT: "Who is with you now?" "Who do you live with?" "Do you have family or friends who you can talk to?"      Brother 9. THERAPIST: "Do you have a counselor or therapist? Name?"     No 10. STRESSORS: "Has there been any new stress or recent changes in your life?"      Going to the ED aggravated this thinking 11. CAFFEINE USE: "Do you drink caffeinated beverages, and how much each day?" (e.g., coffee, tea, colas)       No 12. ALCOHOL USE OR SUBSTANCE USE (DRUG USE): "Do you drink alcohol or use any illegal drugs?"       No 13. OTHER SYMPTOMS: "Do you have any other physical symptoms right now?" (e.g., chest pain, palpitations, difficulty breathing, fever)       Muscle tension and increased heart rate 14. PREGNANCY: "Is there any chance you are pregnant?" "When was your last menstrual period?"       N/A  Protocols used: ANXIETY AND PANIC ATTACK-A-AH

## 2020-08-01 ENCOUNTER — Encounter: Payer: Self-pay | Admitting: Family Medicine

## 2020-08-01 ENCOUNTER — Other Ambulatory Visit: Payer: Self-pay

## 2020-08-01 ENCOUNTER — Ambulatory Visit: Payer: BC Managed Care – PPO | Admitting: Family Medicine

## 2020-08-01 VITALS — BP 143/102 | HR 96 | Temp 99.0°F | Wt 255.0 lb

## 2020-08-01 DIAGNOSIS — I10 Essential (primary) hypertension: Secondary | ICD-10-CM | POA: Diagnosis not present

## 2020-08-01 DIAGNOSIS — E876 Hypokalemia: Secondary | ICD-10-CM | POA: Diagnosis not present

## 2020-08-01 DIAGNOSIS — F1911 Other psychoactive substance abuse, in remission: Secondary | ICD-10-CM | POA: Diagnosis not present

## 2020-08-01 MED ORDER — PROPRANOLOL HCL ER 60 MG PO CP24: 60.0000 mg | ORAL_CAPSULE | Freq: Every day | ORAL | 3 refills | Status: DC

## 2020-08-01 NOTE — Progress Notes (Signed)
New patient visit   Patient: Jeremiah Cameron   DOB: 05-27-1991   29 y.o. Male  MRN: [id number] Visit Date: 08/01/2020  Today's healthcare provider: Dortha Kern, PA   Chief Complaint  Patient presents with   Establish Care   Subjective    Jeremiah Cameron is a 29 y.o. male who presents today as a new patient to establish care.    Past Medical History:  Diagnosis Date   Substance abuse [facility])    Past Surgical History:  Procedure Laterality Date   TUMOR EXCISION     Family Status  Relation Name Status   MGM  (Not Specified)   MGF  (Not Specified)   Family History  Problem Relation Age of Onset   Diabetes Maternal Grandmother    Cancer Maternal Grandfather    Social History   Socioeconomic History   Marital status: Single    Spouse name: Not on file   Number of children: 0   Years of education: Not on file   Highest education level: 12th grade  Occupational History   Not on file  Tobacco Use   Smoking status: Former Smoker    Packs/day: 0.50    Types: Cigarettes    Quit date: 07/23/2020    Years since quitting: 0.0   Smokeless tobacco: Never Used  Vaping Use   Vaping Use: Never used  Substance and Sexual Activity   Alcohol use: Yes   Drug use: Yes    Types: Cocaine, MDMA (Ecstacy)   Sexual activity: Not Currently  Other Topics Concern   Not on file  Social History Narrative   Not on file   Social Determinants of Health   Financial Resource Strain:    Difficulty of Paying Living Expenses: Not on file  Food Insecurity:    Worried About Programme researcher, broadcasting/film/video in the Last Year: Not on file   [employer] of Food in the Last Year: Not on file  Transportation Needs:    Lack of Transportation (Medical): Not on file   Lack of Transportation (Non-Medical): Not on file  Physical Activity:    Days of Exercise per Week: Not on file   Minutes of Exercise per Session: Not on file  Stress:    Feeling of Stress : Not on file  Social Connections:     Frequency of Communication with Friends and Family: Not on file   Frequency of Social Gatherings with Friends and Family: Not on file   Attends Religious Services: Not on file   Active Member of Clubs or Organizations: Not on file   Attends Banker Meetings: Not on file   Marital Status: Not on file   Outpatient Medications Prior to Visit  Medication Sig   cyclobenzaprine (FLEXERIL) 10 MG tablet Take 1 tablet (10 mg total) by mouth 3 (three) times daily as needed.   No facility-administered medications prior to visit.   Allergies  Allergen Reactions   Fish Allergy      There is no immunization history on file for this patient.  Health Maintenance  Topic Date Due   Hepatitis C Screening  Never done   COVID-19 Vaccine (1) Never done   HIV Screening  Never done   TETANUS/TDAP  Never done   INFLUENZA VACCINE  Never done    Patient Care Team: Patient, No Pcp Per as PCP - General (General Practice)  Review of Systems  Constitutional: Negative.   HENT: Negative.   Eyes: Negative.  Respiratory: Positive for apnea, chest tightness and shortness of breath.   Cardiovascular: Negative.   Gastrointestinal: Negative.   Endocrine: Negative.   Genitourinary: Negative.   Musculoskeletal: Negative.   Skin: Negative.   Allergic/Immunologic: Negative.   Neurological: Positive for light-headedness.  Hematological: Negative.   Psychiatric/Behavioral: Positive for decreased concentration and sleep disturbance. The patient is nervous/anxious.       Objective    BP (!) 136/111 (BP Location: Right Arm, Patient Position: Sitting, Cuff Size: Normal)   Pulse 96   Temp 99 F (37.2 C) (Oral)   Wt 255 lb (115.7 kg)   SpO2 97%   BMI 34.58 kg/m   BP Readings from Last 3 Encounters:  08/01/20 (!) 143/102  07/26/20 (!) 127/91  07/25/20 118/84   Wt Readings from Last 3 Encounters:  08/01/20 255 lb (115.7 kg)  07/26/20 210 lb (95.3 kg)  07/24/20 210 lb (95.3 kg)     Physical Exam Constitutional:      General: He is not in acute distress.    Appearance: He is well-developed.  HENT:     Head: Normocephalic and atraumatic.     Right Ear: Hearing and tympanic membrane normal.     Left Ear: Hearing and tympanic membrane normal.     Nose: Nose normal.     Mouth/Throat:     Mouth: Mucous membranes are moist.     Pharynx: Oropharynx is clear.  Eyes:     General: Lids are normal. No scleral icterus.       Right eye: No discharge.        Left eye: No discharge.     Conjunctiva/sclera: Conjunctivae normal.  Cardiovascular:     Rate and Rhythm: Regular rhythm.     Pulses: Normal pulses.     Heart sounds: Normal heart sounds.  Pulmonary:     Effort: Pulmonary effort is normal. No respiratory distress.     Breath sounds: Normal breath sounds.  Abdominal:     General: Bowel sounds are normal.     Palpations: Abdomen is soft.  Musculoskeletal:        General: Normal range of motion.     Cervical back: Neck supple.  Skin:    Findings: No lesion or rash.  Neurological:     Mental Status: He is alert and oriented to person, place, and time.  Psychiatric:        Speech: Speech normal.        Behavior: Behavior normal.        Thought Content: Thought content normal.     Depression Screen PHQ 2/9 Scores 08/01/2020  PHQ - 2 Score 0  PHQ- 9 Score 6   No results found for any visits on 08/01/20.  Assessment & Plan     1. Hypertension, unspecified type BP very high today. Worse than readings in the ER when found to have been involved with substance abuse (Cocaine and Ecstacy). Recommend he stop all illicit drugs, caffeine and limit ETOH to 2 drinks in any 24 hour period. Recheck labs and should follow up BP in 2 weeks. Start Propranolol-ER 60 mg qd. - Comprehensive metabolic panel - CBC with Differential/Platelet  2. Hypokalemia Potassium down to 2.8 on 07-24-20. Was treated in the ER with IV therapy and level came back up to 3.9 by 07-26-20.  Recheck levels. - Comprehensive metabolic panel - CBC with Differential/Platelet  3. History of substance abuse (HCC) History of using Cocaine and Ecstacy recreationally last Sunday. Went to the  ER on 07-24-20 for shortness of breath and muscle cramping suspected secondary to this substance use. Warned against further use and if using more frequently, will need to consider going to detox center.    No follow-ups on file.     Haywood Pao, PA, have reviewed all documentation for this visit. The documentation on 08/01/20 for the exam, diagnosis, procedures, and orders are all accurate and complete.    Dortha Kern, PA  [facility] [phone] (phone) [phone] (fax)  [facility]

## 2020-08-02 LAB — CBC WITH DIFFERENTIAL/PLATELET
Basophils Absolute: 0.1 10*3/uL (ref 0.0–0.2)
Basos: 1 %
EOS (ABSOLUTE): 0 10*3/uL (ref 0.0–0.4)
Eos: 1 %
Hematocrit: 44.9 % (ref 37.5–51.0)
Hemoglobin: 15.4 g/dL (ref 13.0–17.7)
Immature Grans (Abs): 0 10*3/uL (ref 0.0–0.1)
Immature Granulocytes: 0 %
Lymphocytes Absolute: 1.7 10*3/uL (ref 0.7–3.1)
Lymphs: 23 %
MCH: 26 pg — ABNORMAL LOW (ref 26.6–33.0)
MCHC: 34.3 g/dL (ref 31.5–35.7)
MCV: 76 fL — ABNORMAL LOW (ref 79–97)
Monocytes Absolute: 0.5 10*3/uL (ref 0.1–0.9)
Monocytes: 6 %
Neutrophils Absolute: 5.1 10*3/uL (ref 1.4–7.0)
Neutrophils: 69 %
Platelets: 379 10*3/uL (ref 150–450)
RBC: 5.92 x10E6/uL — ABNORMAL HIGH (ref 4.14–5.80)
RDW: 12.5 % (ref 11.6–15.4)
WBC: 7.4 10*3/uL (ref 3.4–10.8)

## 2020-08-02 LAB — COMPREHENSIVE METABOLIC PANEL
ALT: 32 IU/L (ref 0–44)
AST: 24 IU/L (ref 0–40)
Albumin/Globulin Ratio: 1.8 (ref 1.2–2.2)
Albumin: 5.2 g/dL (ref 4.1–5.2)
Alkaline Phosphatase: 97 IU/L (ref 44–121)
BUN/Creatinine Ratio: 11 (ref 9–20)
BUN: 13 mg/dL (ref 6–20)
Bilirubin Total: 0.4 mg/dL (ref 0.0–1.2)
CO2: 24 mmol/L (ref 20–29)
Calcium: 10 mg/dL (ref 8.7–10.2)
Chloride: 102 mmol/L (ref 96–106)
Creatinine, Ser: 1.23 mg/dL (ref 0.76–1.27)
GFR calc Af Amer: 91 mL/min/{1.73_m2} (ref >59)
GFR calc non Af Amer: 79 mL/min/{1.73_m2} (ref >59)
Globulin, Total: 2.9 g/dL (ref 1.5–4.5)
Glucose: 88 mg/dL (ref 65–99)
Potassium: 4.7 mmol/L (ref 3.5–5.2)
Sodium: 142 mmol/L (ref 134–144)
Total Protein: 8.1 g/dL (ref 6.0–8.5)

## 2020-08-15 ENCOUNTER — Other Ambulatory Visit: Payer: Self-pay

## 2020-08-15 ENCOUNTER — Ambulatory Visit: Payer: BC Managed Care – PPO | Admitting: Family Medicine

## 2020-08-15 NOTE — Progress Notes (Deleted)
      Established patient visit   Patient: Jeremiah Cameron   DOB: 1991-05-25   29 y.o. Male  MRN: [id number] Visit Date: 08/15/2020  Today's healthcare provider: Dortha Kern, PA-C   No chief complaint on file.  Subjective    HPI   Hypertension, follow-up  BP Readings from Last 3 Encounters:  08/01/20 (!) 143/102  07/26/20 (!) 127/91  07/25/20 118/84   Wt Readings from Last 3 Encounters:  08/01/20 255 lb (115.7 kg)  07/26/20 210 lb (95.3 kg)  07/24/20 210 lb (95.3 kg)     He was last seen for hypertension 2 weeks ago.  BP at that visit was as above. Management since that visit includes starting patient on Propranolol.  He reports good compliance with treatment. He is not having side effects.  Use of agents associated with hypertension: none.   Outside blood pressures are  . Symptoms: {Yes/No:20286} chest pain {Yes/No:20286} chest pressure  {Yes/No:20286} palpitations {Yes/No:20286} syncope  {Yes/No:20286} dyspnea {Yes/No:20286} orthopnea  {Yes/No:20286} paroxysmal nocturnal dyspnea {Yes/No:20286} lower extremity edema   Pertinent labs: No results found for: CHOL, HDL, LDLCALC, LDLDIRECT, TRIG, CHOLHDL Lab Results  Component Value Date   NA 142 08/01/2020   K 4.7 08/01/2020   CREATININE 1.23 08/01/2020   GFRNONAA 79 08/01/2020   GFRAA 91 08/01/2020   GLUCOSE 88 08/01/2020     The ASCVD Risk score (Goff DC Jr., et al., 2013) failed to calculate for the following reasons:   The 2013 ASCVD risk score is only valid for ages 43 to 74   ---------------------------------------------------------------------------------------------------  {Show patient history (optional):23778::" "}   Medications: Outpatient Medications Prior to Visit  Medication Sig  . cyclobenzaprine (FLEXERIL) 10 MG tablet Take 1 tablet (10 mg total) by mouth 3 (three) times daily as needed.  . propranolol ER (INDERAL LA) 60 MG 24 hr capsule Take 1 capsule (60 mg total) by mouth daily.    No facility-administered medications prior to visit.    Review of Systems  {Heme  Chem  Endocrine  Serology  Results Review (optional):23779::" "}  Objective    There were no vitals taken for this visit. {Show previous vital signs (optional):23777::" "}  Physical Exam  ***  No results found for any visits on 08/15/20.  Assessment & Plan     ***  No follow-ups on file.      {provider attestation***:1}   Dortha Kern, Cordelia Poche  [facility] [phone] (phone) [phone] (fax)  [facility]

## 2020-08-16 ENCOUNTER — Ambulatory Visit: Payer: BC Managed Care – PPO | Admitting: Family Medicine

## 2020-08-16 ENCOUNTER — Encounter: Payer: Self-pay | Admitting: Family Medicine

## 2020-08-16 VITALS — BP 124/83 | HR 72 | Temp 98.2°F | Wt 256.0 lb

## 2020-08-16 DIAGNOSIS — I10 Essential (primary) hypertension: Secondary | ICD-10-CM | POA: Diagnosis not present

## 2020-08-16 NOTE — Progress Notes (Signed)
    Established patient visit   Patient: Jeremiah Cameron   DOB: 12/30/1990   29 y.o. Male  MRN: [id number] Visit Date: 08/16/2020  Today's healthcare provider: Dennis Chrismon, PA-C   No chief complaint on file.  Subjective    HPI  Hypertension, follow-up  BP Readings from Last 3 Encounters:  08/16/20 124/83  08/01/20 (!) 143/102  07/26/20 (!) 127/91   Wt Readings from Last 3 Encounters:  08/16/20 256 lb (116.1 kg)  08/01/20 255 lb (115.7 kg)  07/26/20 210 lb (95.3 kg)     He was last seen for hypertension 2 weeks ago.  BP at that visit was as above. Management since that visit includes initiating Propranolol.  He reports good compliance with treatment. He is having side effects. Decreased energy, however this is starting to get better. He is following a Regular diet.Patient has adjusted his portion sizes, eating healthier snacks had discontinued soda and replaced them with water. He is exercising. He does not smoke.  Patient has quit smoking on 07/31/20.   Use of agents associated with hypertension: none.   Outside blood pressures are not being done. Symptoms: No chest pain No chest pressure  No palpitations No syncope  No dyspnea No orthopnea  No paroxysmal nocturnal dyspnea No lower extremity edema   Pertinent labs: No results found for: CHOL, HDL, LDLCALC, LDLDIRECT, TRIG, CHOLHDL Lab Results  Component Value Date   NA 142 08/01/2020   K 4.7 08/01/2020   CREATININE 1.23 08/01/2020   GFRNONAA 79 08/01/2020   GFRAA 91 08/01/2020   GLUCOSE 88 08/01/2020     The ASCVD Risk score (Goff DC Jr., et al., 2013) failed to calculate for the following reasons:   The 2013 ASCVD risk score is only valid for ages 40 to 79   ---------------------------------------------------------------------------------------------------       Medications: Outpatient Medications Prior to Visit  Medication Sig  . propranolol ER (INDERAL LA) 60 MG 24 hr capsule Take 1  capsule (60 mg total) by mouth daily.  . [DISCONTINUED] cyclobenzaprine (FLEXERIL) 10 MG tablet Take 1 tablet (10 mg total) by mouth 3 (three) times daily as needed.   No facility-administered medications prior to visit.    Review of Systems  Constitutional: Positive for fatigue.  HENT: Negative.   Respiratory: Negative.   Cardiovascular: Negative.   Gastrointestinal: Negative.    . Recent Results (from the past 2160 hour(s))  Basic metabolic panel     Status: Abnormal   Collection Time: 07/24/20  8:33 PM  Result Value Ref Range   Sodium 133 (L) 135 - 145 mmol/L   Potassium 2.8 (L) 3.5 - 5.1 mmol/L   Chloride 95 (L) 98 - 111 mmol/L   CO2 22 22 - 32 mmol/L   Glucose, Bld 105 (H) 70 - 99 mg/dL    Comment: Glucose reference range applies only to samples taken after fasting for at least 8 hours.   BUN 10 6 - 20 mg/dL   Creatinine, Ser 1.30 (H) 0.61 - 1.24 mg/dL   Calcium 9.0 8.9 - 10.3 mg/dL   GFR, Estimated >60 >60 mL/min    Comment: (NOTE) Calculated using the CKD-EPI Creatinine Equation (2021)    Anion gap 16 (H) 5 - 15    Comment: Performed at [facility], [address] [place], [place] [address]  CBC     Status: Abnormal   Collection Time: 07/24/20  8:33 PM  Result Value Ref Range   WBC 15.9 (H)   4.0 - 10.5 K/uL   RBC 5.95 (H) 4.22 - 5.81 MIL/uL   Hemoglobin 15.2 13.0 - 17.0 g/dL   HCT 44.6 39.0 - 52.0 %   MCV 75.0 (L) 80.0 - 100.0 fL   MCH 25.5 (L) 26.0 - 34.0 pg   MCHC 34.1 30.0 - 36.0 g/dL   RDW 12.6 11.5 - 15.5 %   Platelets 408 (H) 150 - 400 K/uL   nRBC 0.0 0.0 - 0.2 %    Comment: Performed at [facility], [address] [place], [place] [address]  Troponin I (High Sensitivity)     Status: None   Collection Time: 07/24/20  8:33 PM  Result Value Ref Range   Troponin I (High Sensitivity) 6 <18 ng/L    Comment: (NOTE) Elevated high sensitivity troponin I (hsTnI) values and significant  changes across serial measurements may suggest ACS  but many other  chronic and acute conditions are known to elevate hsTnI results.  Refer to the "Links" section for chest pain algorithms and additional  guidance. Performed at [facility], [address] [place], [place] [address]   Troponin I (High Sensitivity)     Status: None   Collection Time: 07/24/20 10:41 PM  Result Value Ref Range   Troponin I (High Sensitivity) 7 <18 ng/L    Comment: (NOTE) Elevated high sensitivity troponin I (hsTnI) values and significant  changes across serial measurements may suggest ACS but many other  chronic and acute conditions are known to elevate hsTnI results.  Refer to the "Links" section for chest pain algorithms and additional  guidance. Performed at [facility], [address] [place], [place] [address]   Basic metabolic panel     Status: None   Collection Time: 07/26/20 10:34 AM  Result Value Ref Range   Sodium 136 135 - 145 mmol/L   Potassium 3.9 3.5 - 5.1 mmol/L   Chloride 102 98 - 111 mmol/L   CO2 24 22 - 32 mmol/L   Glucose, Bld 95 70 - 99 mg/dL    Comment: Glucose reference range applies only to samples taken after fasting for at least 8 hours.   BUN 11 6 - 20 mg/dL   Creatinine, Ser 1.04 0.61 - 1.24 mg/dL   Calcium 9.3 8.9 - 10.3 mg/dL   GFR, Estimated >60 >60 mL/min    Comment: (NOTE) Calculated using the CKD-EPI Creatinine Equation (2021)    Anion gap 10 5 - 15    Comment: Performed at [facility], [address] [place], [place] [address]  CBC with Differential     Status: Abnormal   Collection Time: 07/26/20 10:34 AM  Result Value Ref Range   WBC 12.6 (H) 4.0 - 10.5 K/uL   RBC 5.64 4.22 - 5.81 MIL/uL   Hemoglobin 14.6 13.0 - 17.0 g/dL   HCT 43.2 39.0 - 52.0 %   MCV 76.6 (L) 80.0 - 100.0 fL   MCH 25.9 (L) 26.0 - 34.0 pg   MCHC 33.8 30.0 - 36.0 g/dL   RDW 12.9 11.5 - 15.5 %   Platelets 350 150 - 400 K/uL   nRBC 0.2 0.0 - 0.2 %   Neutrophils Relative % 80 %   Neutro Abs 10.1 (H)  1.7 - 7.7 K/uL   Lymphocytes Relative 13 %   Lymphs Abs 1.6 0.7 - 4.0 K/uL   Monocytes Relative 6 %   Monocytes Absolute 0.7 0.1 - 1.0 K/uL   Eosinophils Relative 1 %  Eosinophils Absolute 0.1 0.0 - 0.5 K/uL   Basophils Relative 0 %   Basophils Absolute 0.1 0.0 - 0.1 K/uL   Immature Granulocytes 0 %   Abs Immature Granulocytes 0.05 0.00 - 0.07 K/uL    Comment: Performed at [facility], [address] [place], [place] [address]  Comprehensive metabolic panel     Status: None   Collection Time: 08/01/20  1:30 PM  Result Value Ref Range   Glucose 88 65 - 99 mg/dL   BUN 13 6 - 20 mg/dL   Creatinine, Ser 1.23 0.76 - 1.27 mg/dL   GFR calc non Af Amer 79 >59 mL/min/1.73   GFR calc Af Amer 91 >59 mL/min/1.73    Comment: **In accordance with recommendations from the NKF-ASN Task force,**   [facility] is in the process of updating its eGFR calculation to the   2021 CKD-EPI creatinine equation that estimates kidney function   without a race variable.    BUN/Creatinine Ratio 11 9 - 20   Sodium 142 134 - 144 mmol/L   Potassium 4.7 3.5 - 5.2 mmol/L   Chloride 102 96 - 106 mmol/L   CO2 24 20 - 29 mmol/L   Calcium 10.0 8.7 - 10.2 mg/dL   Total Protein 8.1 6.0 - 8.5 g/dL   Albumin 5.2 4.1 - 5.2 g/dL   Globulin, Total 2.9 1.5 - 4.5 g/dL   Albumin/Globulin Ratio 1.8 1.2 - 2.2   Bilirubin Total 0.4 0.0 - 1.2 mg/dL   Alkaline Phosphatase 97 44 - 121 IU/L    Comment:               **Please note reference interval change**   AST 24 0 - 40 IU/L   ALT 32 0 - 44 IU/L  CBC with Differential/Platelet     Status: Abnormal   Collection Time: 08/01/20  1:30 PM  Result Value Ref Range   WBC 7.4 3.4 - 10.8 x10E3/uL   RBC 5.92 (H) 4.14 - 5.80 x10E6/uL   Hemoglobin 15.4 13.0 - 17.7 g/dL   Hematocrit 44.9 37.5 - 51.0 %   MCV 76 (L) 79 - 97 fL   MCH 26.0 (L) 26.6 - 33.0 pg   MCHC 34.3 31.5 - 35.7 g/dL   RDW 12.5 11.6 - 15.4 %   Platelets 379 150 - 450 x10E3/uL   Neutrophils 69 Not Estab. %    Lymphs 23 Not Estab. %   Monocytes 6 Not Estab. %   Eos 1 Not Estab. %   Basos 1 Not Estab. %   Neutrophils Absolute 5.1 1.4 - 7.0 x10E3/uL   Lymphocytes Absolute 1.7 0.7 - 3.1 x10E3/uL   Monocytes Absolute 0.5 0.1 - 0.9 x10E3/uL   EOS (ABSOLUTE) 0.0 0.0 - 0.4 x10E3/uL   Basophils Absolute 0.1 0.0 - 0.2 x10E3/uL   Immature Granulocytes 0 Not Estab. %   Immature Grans (Abs) 0.0 0.0 - 0.1 x10E3/uL       Objective    BP 124/83 (BP Location: Right Arm, Patient Position: Sitting, Cuff Size: Normal)   Pulse 72   Temp 98.2 F (36.8 C) (Oral)   Wt 256 lb (116.1 kg)   SpO2 100%   BMI 34.72 kg/m     Physical Exam Constitutional:      General: He is not in acute distress.    Appearance: He is well-developed and well-nourished.  HENT:     Head: Normocephalic and atraumatic.     Right Ear: Hearing normal.       Left Ear: Hearing normal.     Nose: Nose normal.  Eyes:     General: Lids are normal. No scleral icterus.       Right eye: No discharge.        Left eye: No discharge.     Conjunctiva/sclera: Conjunctivae normal.  Cardiovascular:     Rate and Rhythm: Normal rate and regular rhythm.     Heart sounds: Normal heart sounds.  Pulmonary:     Effort: Pulmonary effort is normal. No respiratory distress.     Breath sounds: Normal breath sounds.  Abdominal:     General: Bowel sounds are normal.  Musculoskeletal:        General: Normal range of motion.  Skin:    General: Skin is intact.     Findings: No lesion or rash.  Neurological:     Mental Status: He is alert and oriented to person, place, and time.  Psychiatric:        Mood and Affect: Mood and affect normal.        Speech: Speech normal.        Behavior: Behavior normal.        Thought Content: Thought content normal.       No results found for any visits on 08/16/20.  Assessment & Plan     1. Hypertension, unspecified type Well controlled BP with lifestyle changes and Propranolol-ER 60 mg qd. All blood  tests results were essentially normal. Continue present regimen and recheck BP in 3 months.   No follow-ups on file.      I, Rya Rausch, PA-C, have reviewed all documentation for this visit. The documentation on 08/16/20 for the exam, diagnosis, procedures, and orders are all accurate and complete.    Vernie Murders, PA-C  [employer] [phone] (phone) [phone] (fax)  [place]

## 2020-08-23 ENCOUNTER — Ambulatory Visit: Payer: Self-pay | Admitting: Family Medicine

## 2020-09-08 DIAGNOSIS — Z03818 Encounter for observation for suspected exposure to other biological agents ruled out: Secondary | ICD-10-CM | POA: Diagnosis not present

## 2020-09-08 DIAGNOSIS — Z7189 Other specified counseling: Secondary | ICD-10-CM | POA: Diagnosis not present

## 2020-09-08 DIAGNOSIS — U071 COVID-19: Secondary | ICD-10-CM | POA: Diagnosis not present

## 2020-09-29 ENCOUNTER — Ambulatory Visit: Payer: BC Managed Care – PPO | Admitting: Family Medicine

## 2020-09-29 ENCOUNTER — Other Ambulatory Visit: Payer: Self-pay

## 2020-09-29 ENCOUNTER — Encounter: Payer: Self-pay | Admitting: Family Medicine

## 2020-09-29 VITALS — BP 127/94 | HR 74 | Temp 98.4°F | Wt 245.0 lb

## 2020-09-29 DIAGNOSIS — Z8616 Personal history of COVID-19: Secondary | ICD-10-CM | POA: Diagnosis not present

## 2020-09-29 DIAGNOSIS — R4189 Other symptoms and signs involving cognitive functions and awareness: Secondary | ICD-10-CM | POA: Diagnosis not present

## 2020-09-29 DIAGNOSIS — I10 Essential (primary) hypertension: Secondary | ICD-10-CM

## 2020-09-29 DIAGNOSIS — R0789 Other chest pain: Secondary | ICD-10-CM | POA: Diagnosis not present

## 2020-09-29 MED ORDER — PREDNISONE 10 MG PO TABS: ORAL_TABLET | ORAL | 0 refills | Status: DC

## 2020-09-29 NOTE — Progress Notes (Signed)
Acute Office Visit  Subjective:    Patient ID: Jeremiah Cameron, male    DOB: 1991/04/30, 30 y.o.   MRN: [id number]  No chief complaint on file.   HPI Patient is in today for evaluation of dizziness.  Patient states that for about 2-3 days he feels like he has had "brain fog".   He reports having chest twinges on and off all day, throbbing all over his body, some irregular heart beat and blurred vision.  It is worse when he walks and feels that when he is sitting if he stretches it makes it better. He does note that the symptoms seem to be more prominent right before going to sleep and when he first wakes up.  He states he experiences shortness of breath throughout the day sometimes. He does relate that he had some cough earlier in the month which ended about the 14th. He describes his chest as feeling like it is fatigued the way it gets after you work out. Finally remembers he has had chest tightness, aches, cough and nausea starting 09-04-20 and was had a positive COVID test at [facility] clinic on 09-08-20. Was treated with Doxycycline and Prednisone for a week.  Past Medical History:  Diagnosis Date  . Substance abuse [facility])     Past Surgical History:  Procedure Laterality Date  . TUMOR EXCISION      Family History  Problem Relation Age of Onset  . Diabetes Maternal Grandmother   . Cancer Maternal Grandfather     Social History   Socioeconomic History  . Marital status: Single    Spouse name: Not on file  . Number of children: 0  . Years of education: Not on file  . Highest education level: 12th grade  Occupational History  . Not on file  Tobacco Use  . Smoking status: Former Smoker    Packs/day: 0.50    Types: Cigarettes    Quit date: 07/23/2020    Years since quitting: 0.1  . Smokeless tobacco: Never Used  Vaping Use  . Vaping Use: Never used  Substance and Sexual Activity  . Alcohol use: Yes  . Drug use: Yes    Types: Cocaine, MDMA (Ecstacy)  . Sexual activity:  Not Currently  Other Topics Concern  . Not on file  Social History Narrative  . Not on file   Social Determinants of Health   Financial Resource Strain: Not on file  Food Insecurity: Not on file  Transportation Needs: Not on file  Physical Activity: Not on file  Stress: Not on file  Social Connections: Not on file  Intimate Partner Violence: Not on file    Outpatient Medications Prior to Visit  Medication Sig Dispense Refill  . propranolol ER (INDERAL LA) 60 MG 24 hr capsule Take 1 capsule (60 mg total) by mouth daily. 30 capsule 3   No facility-administered medications prior to visit.    Allergies  Allergen Reactions  . Fish Allergy     Review of Systems  Constitutional: Negative for chills, diaphoresis, fatigue and fever.  HENT: Negative for congestion, ear pain, postnasal drip, rhinorrhea, sinus pressure, sinus pain, sneezing and sore throat.   Eyes: Positive for visual disturbance.  Respiratory: Positive for shortness of breath. Negative for cough and chest tightness.   Cardiovascular: Negative for chest pain.  Neurological: Positive for dizziness. Negative for headaches.     Objective:    Physical Exam Constitutional:      General: He is not in acute  distress.    Appearance: He is well-developed and well-nourished.  HENT:     Head: Normocephalic and atraumatic.     Right Ear: Hearing normal.     Left Ear: Hearing normal.     Nose: Nose normal.  Eyes:     General: Lids are normal. No scleral icterus.       Right eye: No discharge.        Left eye: No discharge.     Conjunctiva/sclera: Conjunctivae normal.  Cardiovascular:     Rate and Rhythm: Normal rate and regular rhythm.     Heart sounds: Normal heart sounds.  Pulmonary:     Effort: Pulmonary effort is normal. No respiratory distress.     Breath sounds: Normal breath sounds.  Abdominal:     General: Bowel sounds are normal.     Palpations: Abdomen is soft.  Musculoskeletal:        General: Normal  range of motion.     Cervical back: Neck supple.  Skin:    General: Skin is intact.     Findings: No lesion or rash.  Neurological:     Mental Status: He is alert and oriented to person, place, and time.  Psychiatric:        Mood and Affect: Mood and affect normal.        Speech: Speech normal.        Behavior: Behavior normal.        Thought Content: Thought content normal.     BP (!) 127/94 (BP Location: Right Arm, Patient Position: Sitting, Cuff Size: Normal)   Pulse 74   Temp 98.4 F (36.9 C) (Oral)   Wt 245 lb (111.1 kg)   SpO2 100%   BMI 33.23 kg/m   BP Readings from Last 3 Encounters:  09/29/20 (!) 127/94  08/16/20 124/83  08/01/20 (!) 143/102    Wt Readings from Last 3 Encounters:  09/29/20 245 lb (111.1 kg)  08/16/20 256 lb (116.1 kg)  08/01/20 255 lb (115.7 kg)    Health Maintenance Due  Topic Date Due  . Hepatitis C Screening  Never done  . COVID-19 Vaccine (1) Never done  . HIV Screening  Never done  . TETANUS/TDAP  Never done  . INFLUENZA VACCINE  Never done    There are no preventive care reminders to display for this patient.   No results found for: TSH Lab Results  Component Value Date   WBC 7.4 08/01/2020   HGB 15.4 08/01/2020   HCT 44.9 08/01/2020   MCV 76 (L) 08/01/2020   PLT 379 08/01/2020   Lab Results  Component Value Date   NA 142 08/01/2020   K 4.7 08/01/2020   CO2 24 08/01/2020   GLUCOSE 88 08/01/2020   BUN 13 08/01/2020   CREATININE 1.23 08/01/2020   BILITOT 0.4 08/01/2020   ALKPHOS 97 08/01/2020   AST 24 08/01/2020   ALT 32 08/01/2020   PROT 8.1 08/01/2020   ALBUMIN 5.2 08/01/2020   CALCIUM 10.0 08/01/2020   ANIONGAP 10 07/26/2020       Assessment & Plan:    1. Hypertension, unspecified type Stable on the Inderal-LA but having some brain fog since diagnosis of COVID infection on 09-08-20. Check routine follow up labs. - CBC with Differential/Platelet - Comprehensive metabolic panel - TSH  2. Brain  fog Feeling fuzzy headed and difficulty in focusing since COVID infection the first of January 2022. No tachycardia, heart murmur, significant headaches, sinus congestion or earaches.  Check labs for metabolic disorders. EKG NSR. Will give prednisone taper and increase fluid intake. - CBC with Differential/Platelet - Comprehensive metabolic panel - TSH - EKG 12-Lead - predniSONE (DELTASONE) 10 MG tablet; Taper down by 1 tablet by mouth daily starting at 6 day 1, then, 5 day 2, 4 day 3, 3 day 4, 2 day 5 and 1 day 6. Divide dosage among meals and bedtime each day.  Dispense: 21 tablet; Refill: 0  3. Personal history of COVID-19 Started having a cough and sore throat with malaise on 09-04-20 and had a positive COVID test on 09-08-20 at [facility]. No fever or sore throat today. Some brain fog and intermittent chest discomfort with malaise/fatigue remaining. Will give prednisone taper and advised he may have a little of the symptoms persist due to this virus. Recheck in a month, or sooner, if needed. - predniSONE (DELTASONE) 10 MG tablet; Taper down by 1 tablet by mouth daily starting at 6 day 1, then, 5 day 2, 4 day 3, 3 day 4, 2 day 5 and 1 day 6. Divide dosage among meals and bedtime each day.  Dispense: 21 tablet; Refill: 0  4. Chest discomfort Some pressure ache with brain fog intermittently over the past couple weeks. EKG normal sinus rhythm. Will check for hydration level. Suspect leftover effects of COVID infection. - CBC with Differential/Platelet - EKG 12-Lead   No orders of the defined types were placed in this encounter.    Adline Peals, CMA   I, Dortha Kern, PA-C, have reviewed all documentation for this visit. The documentation on 09/29/20 for the exam, diagnosis, procedures, and orders are all accurate and complete.

## 2020-09-30 ENCOUNTER — Telehealth: Payer: Self-pay | Admitting: Family Medicine

## 2020-09-30 LAB — CBC WITH DIFFERENTIAL/PLATELET
Basophils Absolute: 0 10*3/uL (ref 0.0–0.2)
Basos: 0 %
EOS (ABSOLUTE): 0.1 10*3/uL (ref 0.0–0.4)
Eos: 1 %
Hematocrit: 42.7 % (ref 37.5–51.0)
Hemoglobin: 14 g/dL (ref 13.0–17.7)
Immature Grans (Abs): 0 10*3/uL (ref 0.0–0.1)
Immature Granulocytes: 0 %
Lymphocytes Absolute: 1.8 10*3/uL (ref 0.7–3.1)
Lymphs: 24 %
MCH: 25.2 pg — ABNORMAL LOW (ref 26.6–33.0)
MCHC: 32.8 g/dL (ref 31.5–35.7)
MCV: 77 fL — ABNORMAL LOW (ref 79–97)
Monocytes Absolute: 0.6 10*3/uL (ref 0.1–0.9)
Monocytes: 8 %
Neutrophils Absolute: 5.1 10*3/uL (ref 1.4–7.0)
Neutrophils: 67 %
Platelets: 350 10*3/uL (ref 150–450)
RBC: 5.56 x10E6/uL (ref 4.14–5.80)
RDW: 12.6 % (ref 11.6–15.4)
WBC: 7.6 10*3/uL (ref 3.4–10.8)

## 2020-09-30 LAB — COMPREHENSIVE METABOLIC PANEL
ALT: 24 IU/L (ref 0–44)
AST: 23 IU/L (ref 0–40)
Albumin/Globulin Ratio: 1.6 (ref 1.2–2.2)
Albumin: 4.9 g/dL (ref 4.1–5.2)
Alkaline Phosphatase: 86 IU/L (ref 44–121)
BUN/Creatinine Ratio: 6 — ABNORMAL LOW (ref 9–20)
BUN: 8 mg/dL (ref 6–20)
Bilirubin Total: 0.5 mg/dL (ref 0.0–1.2)
CO2: 23 mmol/L (ref 20–29)
Calcium: 9.8 mg/dL (ref 8.7–10.2)
Chloride: 101 mmol/L (ref 96–106)
Creatinine, Ser: 1.25 mg/dL (ref 0.76–1.27)
GFR calc Af Amer: 89 mL/min/{1.73_m2} (ref >59)
GFR calc non Af Amer: 77 mL/min/{1.73_m2} (ref >59)
Globulin, Total: 3 g/dL (ref 1.5–4.5)
Glucose: 85 mg/dL (ref 65–99)
Potassium: 4.8 mmol/L (ref 3.5–5.2)
Sodium: 140 mmol/L (ref 134–144)
Total Protein: 7.9 g/dL (ref 6.0–8.5)

## 2020-09-30 LAB — TSH: TSH: 0.842 u[IU]/mL (ref 0.450–4.500)

## 2020-09-30 NOTE — Telephone Encounter (Signed)
Patient called and was read lab note By Dortha Kern PA written 10/01/19.  He verbalized understanding of all information

## 2020-11-09 ENCOUNTER — Emergency Department
Admission: EM | Admit: 2020-11-09 | Discharge: 2020-11-09 | Disposition: A | Discharge status: Home / Self Care | Payer: BC Managed Care – PPO | Attending: Emergency Medicine | Admitting: Emergency Medicine

## 2020-11-09 ENCOUNTER — Other Ambulatory Visit: Payer: Self-pay

## 2020-11-09 ENCOUNTER — Ambulatory Visit: Payer: Self-pay | Admitting: *Deleted

## 2020-11-09 ENCOUNTER — Emergency Department: Payer: BC Managed Care – PPO

## 2020-11-09 ENCOUNTER — Encounter: Payer: Self-pay | Admitting: Emergency Medicine

## 2020-11-09 DIAGNOSIS — Z87891 Personal history of nicotine dependence: Secondary | ICD-10-CM | POA: Insufficient documentation

## 2020-11-09 DIAGNOSIS — R0789 Other chest pain: Secondary | ICD-10-CM | POA: Diagnosis not present

## 2020-11-09 DIAGNOSIS — R079 Chest pain, unspecified: Secondary | ICD-10-CM | POA: Diagnosis not present

## 2020-11-09 LAB — BASIC METABOLIC PANEL
Anion gap: 8 (ref 5–15)
BUN: 14 mg/dL (ref 6–20)
CO2: 28 mmol/L (ref 22–32)
Calcium: 9.5 mg/dL (ref 8.9–10.3)
Chloride: 104 mmol/L (ref 98–111)
Creatinine, Ser: 1.11 mg/dL (ref 0.61–1.24)
GFR, Estimated: >60 mL/min (ref >60)
Glucose, Bld: 91 mg/dL (ref 70–99)
Potassium: 4.2 mmol/L (ref 3.5–5.1)
Sodium: 140 mmol/L (ref 135–145)

## 2020-11-09 LAB — CBC
HCT: 41.5 % (ref 39.0–52.0)
Hemoglobin: 13.4 g/dL (ref 13.0–17.0)
MCH: 25.1 pg — ABNORMAL LOW (ref 26.0–34.0)
MCHC: 32.3 g/dL (ref 30.0–36.0)
MCV: 77.9 fL — ABNORMAL LOW (ref 80.0–100.0)
Platelets: 328 K/uL (ref 150–400)
RBC: 5.33 MIL/uL (ref 4.22–5.81)
RDW: 12.9 % (ref 11.5–15.5)
WBC: 7.9 K/uL (ref 4.0–10.5)
nRBC: 0 % (ref 0.0–0.2)

## 2020-11-09 LAB — TROPONIN I (HIGH SENSITIVITY): Troponin I (High Sensitivity): <2 ng/L

## 2020-11-09 MED ORDER — OMEPRAZOLE MAGNESIUM 20 MG PO TBEC: 20.0000 mg | DELAYED_RELEASE_TABLET | Freq: Every day | ORAL | 1 refills | Status: DC

## 2020-11-09 NOTE — ED Triage Notes (Signed)
Pt c/o intermittent left sided chest tightness with SOB since Sunday. Denies N/V/diaphoresis, pt is ambulatory to triage with no distress noted

## 2020-11-09 NOTE — ED Notes (Signed)
This nurse was attending to other critical pt. Pt in hall bed, in NAD. EDP has examined pt. Pt states that since 3d ago, pt feels heaviness in chest and palpitations when lays down to sleep. Pt felt same pain this morning, "gripping, tight, pressure" in mid chest. Non-radiating. Pain was at most 5/10. Denies pain at this time.

## 2020-11-09 NOTE — ED Provider Notes (Signed)
[facility] Emergency Department Provider Note   ____________________________________________   Event Date/Time   First MD Initiated Contact with Patient 11/09/20 1408     (approximate)  I have reviewed the triage vital signs and the nursing notes.   HISTORY  Chief Complaint Chest Pain    HPI Jeremiah Cameron is a 30 y.o. male with no significant past medical history who presents to the ED complaining of chest pain.  Patient reports that he has been having intermittent issues with tightness and squeezing in his chest for the past couple of weeks.  Patient states it seems to be worse at night when he goes to lay flat.  He has not had any associated fevers, cough, nausea, vomiting, diaphoresis, pain or swelling in his legs.  He had an episode earlier this morning and spoke with his PCPs office, who recommended he be evaluated in the ED.  He states that now his symptoms have resolved and he feels back to normal.        Past Medical History:  Diagnosis Date  . Substance abuse (HCC)     There are no problems to display for this patient.   Past Surgical History:  Procedure Laterality Date  . TUMOR EXCISION      Prior to Admission medications   Medication Sig Start Date End Date Taking? Authorizing Provider  omeprazole (PRILOSEC OTC) 20 MG tablet Take 1 tablet (20 mg total) by mouth daily. 11/09/20 11/09/21 Yes Chesley Noon, MD  predniSONE (DELTASONE) 10 MG tablet Taper down by 1 tablet by mouth daily starting at 6 day 1, then, 5 day 2, 4 day 3, 3 day 4, 2 day 5 and 1 day 6. Divide dosage among meals and bedtime each day. 09/29/20   Chrismon, Jodell Cipro, PA-C  propranolol ER (INDERAL LA) 60 MG 24 hr capsule Take 1 capsule (60 mg total) by mouth daily. 08/01/20   Chrismon, Jodell Cipro, PA-C    Allergies Fish allergy  Family History  Problem Relation Age of Onset  . Diabetes Maternal Grandmother   . Cancer Maternal Grandfather     Social  History Social History   Tobacco Use  . Smoking status: Former Smoker    Packs/day: 0.50    Types: Cigarettes    Quit date: 07/23/2020    Years since quitting: 0.2  . Smokeless tobacco: Never Used  Vaping Use  . Vaping Use: Never used  Substance Use Topics  . Alcohol use: Not Currently  . Drug use: Yes    Types: Cocaine, MDMA (Ecstacy)    Comment: last use 2021    Review of Systems  Constitutional: No fever/chills Eyes: No visual changes. ENT: No sore throat. Cardiovascular: Positive for chest pain. Respiratory: Positive for shortness of breath. Gastrointestinal: No abdominal pain.  No nausea, no vomiting.  No diarrhea.  No constipation. Genitourinary: Negative for dysuria. Musculoskeletal: Negative for back pain. Skin: Negative for rash. Neurological: Negative for headaches, focal weakness or numbness.  ____________________________________________   PHYSICAL EXAM:  VITAL SIGNS: ED Triage Vitals [11/09/20 1322]  Enc Vitals Group     BP      Pulse      Resp      Temp      Temp src      SpO2      Weight 244 lb (110.7 kg)     Height 6' (1.829 m)     Head Circumference      Peak Flow  Pain Score 2     Pain Loc      Pain Edu?      Excl. in GC?     Constitutional: Alert and oriented. Eyes: Conjunctivae are normal. Head: Atraumatic. Nose: No congestion/rhinnorhea. Mouth/Throat: Mucous membranes are moist. Neck: Normal ROM Cardiovascular: Normal rate, regular rhythm. Grossly normal heart sounds.  2+ radial pulses bilaterally. Respiratory: Normal respiratory effort.  No retractions. Lungs CTAB.  No chest wall tenderness to palpation. Gastrointestinal: Soft and nontender. No distention. Genitourinary: deferred Musculoskeletal: No lower extremity tenderness nor edema. Neurologic:  Normal speech and language. No gross focal neurologic deficits are appreciated. Skin:  Skin is warm, dry and intact. No rash noted. Psychiatric: Mood and affect are normal.  Speech and behavior are normal.  ____________________________________________   LABS (all labs ordered are listed, but only abnormal results are displayed)  Labs Reviewed  CBC - Abnormal; Notable for the following components:      Result Value   MCV 77.9 (*)    MCH 25.1 (*)    All other components within normal limits  BASIC METABOLIC PANEL  TROPONIN I (HIGH SENSITIVITY)   ____________________________________________  EKG  ED ECG REPORT I, Chesley Noon, the attending physician, personally viewed and interpreted this ECG.   Date: 11/09/2020  EKG Time: 13:18  Rate: 72  Rhythm: normal sinus rhythm  Axis: Normal  Intervals:none  ST&T Change: None   PROCEDURES  Procedure(s) performed (including Critical Care):  Procedures   ____________________________________________   INITIAL IMPRESSION / ASSESSMENT AND PLAN / ED COURSE       30 year old male with no significant past medical history presents to the ED complaining of intermittent episodes of chest pressure and tightness for the past couple of weeks, often associated with difficulty catching his breath.  EKG shows no evidence of arrhythmia or ischemia and initial troponin is negative, low suspicion for ACS given lack of risk factors and atypical symptoms.  I doubt dissection or PE as he is currently asymptomatic, vital signs reassuring and he is PERC negative.  Symptoms do seem to be worse when he goes to lay flat at night and there may be a component of reflux.  Patient may benefit from being started on a PPI, otherwise he is appropriate for discharge home with PCP follow-up.      ____________________________________________   FINAL CLINICAL IMPRESSION(S) / ED DIAGNOSES  Final diagnoses:  Nonspecific chest pain     ED Discharge Orders         Ordered    omeprazole (PRILOSEC OTC) 20 MG tablet  Daily        11/09/20 1507           Note:  This document was prepared using Dragon voice recognition  software and may include unintentional dictation errors.   Chesley Noon, MD 11/09/20 [id number]

## 2020-11-09 NOTE — Telephone Encounter (Signed)
Pt called in c/o having chest tightness/squeezing pain in his left chest in the middle to upper chest especially for the last 3 nights.   "I feel like my chest is caving in".  No coughing except "I cough to see if it helps but it doesn't".   "It is bad enough I can't sleep".   "I feel a pulsating or rapid heart beat".    Pt had covid in Jan. 2022.  He had shortness of breath with covid however he does not feel short of breath nearly like when he had covid.   He still has brain fog and blurry vision which has gotten worse since having covid.   C/o having dizziness that's in present in the mornings and lasts most of the day until he gets home from work and sits down.   He says it's not real bad.   He stills functions on his job.  I have referred him to the ED per the protocol for chest pain.   He is agreeable and is going to [facility].  I have forwarded my notes to [facility] for [employer].   Reason for Disposition . Dizziness or lightheadedness    With left tightness/squeezing in his left chest.   At night feels like his "chest is caving in".   Had covid in Jan. 2022  Answer Assessment - Initial Assessment Questions 1. LOCATION: "Where does it hurt?"       Having chest pain on left side of chest in middle to upper area.   It's a lot of discomfort for last 3 nights.  I feel like my chest is squeezing.  It keeps me from sleeping.   No sore throat or runny nose. I cough when I feel my chest is tight and running out of breath.    "I think it's a coping mechanism but it doesn't help.  I had covid in Jan 2022.   I had shortness of breath with covid.    The shortness of breath is better than when I had covid.   It's dry cough. No fever.   Random back aches.    2. RADIATION: "Does the pain go anywhere else?" (e.g., into neck, jaw, arms, back)     A deep breath causes soreness in my chest.   3. ONSET: "When did the chest pain begin?" (Minutes, hours or  days)      I'm on propanolol  4. PATTERN "Does the pain come and go, or has it been constant since it started?"  "Does it get worse with exertion?"      With exertion I may feel some palpitations.    "That's normal"    It's weird it's like an increased heart rate.    I feel this in my left arm upper part.     After I have the chest tightness while trying to go to sleep.   I feel like my chest is pulsating rapidly.     5. DURATION: "How long does it last" (e.g., seconds, minutes, hours)     Last an hour or so the pulsating and tightness in my chest.  I'm still having blurry vision and random dizziness and brain fog from the covid.   6. SEVERITY: "How bad is the pain?"  (e.g., Scale 1-10; mild, moderate, or severe)    - MILD (1-3): doesn't interfere with normal activities     - MODERATE (4-7): interferes with normal activities or awakens from sleep    -  SEVERE (8-10): excruciating pain, unable to do any normal activities       5-6 on scale when the chest pain is at it's worse. 7. CARDIAC RISK FACTORS: "Do you have any history of heart problems or risk factors for heart disease?" (e.g., angina, prior heart attack; diabetes, high blood pressure, high cholesterol, smoker, or strong family history of heart disease)     No except hypertension controlled with medication. 8. PULMONARY RISK FACTORS: "Do you have any history of lung disease?"  (e.g., blood clots in lung, asthma, emphysema, birth control pills)     Former smoker quit last Oct.   No other lung issues.   9. CAUSE: "What do you think is causing the chest pain?"     Maybe anxiety.   I don't feel anxious.  A couple of months ago I had my first panic attack.   This feels the same with the squeezing in my chest with anxiety.   No treatment for anxiety.  Maybe from covid.   This all started after covid.   10. OTHER SYMPTOMS: "Do you have any other symptoms?" (e.g., dizziness, nausea, vomiting, sweating, fever, difficulty breathing, cough)        Having dizzy spells in the mornings and it lasts until the afternoon.   I lean to the right side of my body.   After I get home and sit down and rest it goes away.    I loss focus when driving sometimes since having covid.   Since having covid the brain fog has become worse and the blurry vision is worse.    11. PREGNANCY: "Is there any chance you are pregnant?" "When was your last menstrual period?"       N/A  Protocols used: CHEST PAIN-A-AH

## 2020-11-21 ENCOUNTER — Other Ambulatory Visit: Payer: Self-pay | Admitting: Family Medicine

## 2020-11-21 DIAGNOSIS — I10 Essential (primary) hypertension: Secondary | ICD-10-CM

## 2020-11-24 DIAGNOSIS — H5210 Myopia, unspecified eye: Secondary | ICD-10-CM | POA: Diagnosis not present

## 2021-01-09 ENCOUNTER — Encounter: Payer: Self-pay | Admitting: Family Medicine

## 2021-01-09 ENCOUNTER — Other Ambulatory Visit: Payer: Self-pay

## 2021-01-09 ENCOUNTER — Ambulatory Visit: Payer: BC Managed Care – PPO | Admitting: Family Medicine

## 2021-01-09 VITALS — BP 129/84 | HR 80 | Temp 98.4°F | Wt 258.0 lb

## 2021-01-09 DIAGNOSIS — I10 Essential (primary) hypertension: Secondary | ICD-10-CM

## 2021-01-09 DIAGNOSIS — M62830 Muscle spasm of back: Secondary | ICD-10-CM

## 2021-01-09 MED ORDER — METHOCARBAMOL 500 MG PO TABS: 500.0000 mg | ORAL_TABLET | Freq: Four times a day (QID) | ORAL | 0 refills | Status: DC

## 2021-01-09 NOTE — Progress Notes (Signed)
Established patient visit   Patient: Jeremiah Cameron   DOB: Jan 09, 1991   30 y.o. Male  MRN: [id number] Visit Date: 01/09/2021  Today's healthcare provider: Dortha Kern, PA-C   No chief complaint on file.  Subjective    HPI  Hypertension, follow-up  BP Readings from Last 3 Encounters:  01/09/21 129/84  11/09/20 135/89  09/29/20 (!) 127/94   Wt Readings from Last 3 Encounters:  01/09/21 258 lb (117 kg)  11/09/20 244 lb (110.7 kg)  09/29/20 245 lb (111.1 kg)     He was last seen for hypertension 3 months ago.  BP at that visit was as above. Management since that visit includes none.  He reports good compliance with treatment. He is not having side effects.   Use of agents associated with hypertension: none.   Outside blood pressures are not being checked. Symptoms: No chest pain No chest pressure  No palpitations No syncope  No dyspnea No orthopnea  No paroxysmal nocturnal dyspnea No lower extremity edema   Pertinent labs: No results found for: CHOL, HDL, LDLCALC, LDLDIRECT, TRIG, CHOLHDL Lab Results  Component Value Date   NA 140 11/09/2020   K 4.2 11/09/2020   CREATININE 1.11 11/09/2020   GFRNONAA >60 11/09/2020   GFRAA 89 09/29/2020   GLUCOSE 91 11/09/2020     The ASCVD Risk score (Goff DC Jr., et al., 2013) failed to calculate for the following reasons:   The 2013 ASCVD risk score is only valid for ages 35 to 68   ---------------------------------------------------------------------------------------------------  There are no problems to display for this patient.  Past Medical History:  Diagnosis Date  . Substance abuse [facility])    Past Surgical History:  Procedure Laterality Date  . TUMOR EXCISION     Family History  Problem Relation Age of Onset  . Diabetes Maternal Grandmother   . Cancer Maternal Grandfather    Social History   Tobacco Use  . Smoking status: Former Smoker    Packs/day: 0.50    Types: Cigarettes    Quit date:  07/23/2020    Years since quitting: 0.4  . Smokeless tobacco: Never Used  Vaping Use  . Vaping Use: Never used  Substance Use Topics  . Alcohol use: Not Currently  . Drug use: Yes    Types: Cocaine, MDMA (Ecstacy)    Comment: last use 2021   Allergies  Allergen Reactions  . Fish Allergy      Medications: Outpatient Medications Prior to Visit  Medication Sig  . omeprazole (PRILOSEC OTC) 20 MG tablet Take 1 tablet (20 mg total) by mouth daily.  . predniSONE (DELTASONE) 10 MG tablet Taper down by 1 tablet by mouth daily starting at 6 day 1, then, 5 day 2, 4 day 3, 3 day 4, 2 day 5 and 1 day 6. Divide dosage among meals and bedtime each day.  . propranolol ER (INDERAL LA) 60 MG 24 hr capsule TAKE 1 CAPSULE BY MOUTH EVERY DAY   No facility-administered medications prior to visit.    Review of Systems  Constitutional: Negative.   HENT: Negative.   Eyes: Negative.   Respiratory: Negative.   Cardiovascular: Negative.   Gastrointestinal: Negative.   Genitourinary: Negative.   Musculoskeletal: Negative.         Objective    BP 129/84 (BP Location: Right Arm, Patient Position: Sitting, Cuff Size: Normal)   Pulse 80   Temp 98.4 F (36.9 C) (Oral)   Wt 258 lb (117  kg)   SpO2 98%   BMI 34.99 kg/m      Physical Exam Constitutional:      General: He is not in acute distress.    Appearance: He is well-developed.  HENT:     Head: Normocephalic and atraumatic.     Right Ear: Hearing normal.     Left Ear: Hearing normal.     Nose: Nose normal.  Eyes:     General: Lids are normal. No scleral icterus.       Right eye: No discharge.        Left eye: No discharge.     Conjunctiva/sclera: Conjunctivae normal.  Cardiovascular:     Rate and Rhythm: Normal rate and regular rhythm.     Heart sounds: Normal heart sounds.  Pulmonary:     Effort: Pulmonary effort is normal. No respiratory distress.     Breath sounds: Normal breath sounds.  Abdominal:     General: Bowel sounds  are normal.     Palpations: Abdomen is soft.  Musculoskeletal:        General: Normal range of motion.  Skin:    Findings: No lesion or rash.  Neurological:     Mental Status: He is alert and oriented to person, place, and time.  Psychiatric:        Speech: Speech normal.        Behavior: Behavior normal.        Thought Content: Thought content normal.      No results found for any visits on 01/09/21.  Assessment & Plan     1. Hypertension, unspecified type Well controlled BP without side effects from the Propranolol. Continue low sodium diet and work on exercise for weight loss. Recheck in 6 months.  2. Spasm of muscle of lower back Onset 2 weeks ago without known injury. Occurs while sitting at his desk for long periods. Recommend NSAID and muscle relaxer with moist heat. Given handout for back exercises. Recheck prn. - methocarbamol (ROBAXIN) 500 MG tablet; Take 1 tablet (500 mg total) by mouth 4 (four) times daily.  Dispense: 30 tablet; Refill: 0    No follow-ups on file.      I, Laurann Mcmorris, PA-C, have reviewed all documentation for this visit. The documentation on 01/09/21 for the exam, diagnosis, procedures, and orders are all accurate and complete.    Dortha Kern, PA-C  [employer] [phone] (phone) [phone] (fax)  [facility]

## 2021-01-09 NOTE — Patient Instructions (Signed)
Back Exercises These exercises help to make your trunk and back strong. They also help to keep the lower back flexible. Doing these exercises can help to prevent back pain or lessen existing pain.  If you have back pain, try to do these exercises 2-3 times each day or as told by your doctor.  As you get better, do the exercises once each day. Repeat the exercises more often as told by your doctor.  To stop back pain from coming back, do the exercises once each day, or as told by your doctor. Exercises Single knee to chest Do these steps 3-5 times in a row for each leg: 1. Lie on your back on a firm bed or the floor with your legs stretched out. 2. Bring one knee to your chest. 3. Grab your knee or thigh with both hands and hold them it in place. 4. Pull on your knee until you feel a gentle stretch in your lower back or buttocks. 5. Keep doing the stretch for 10-30 seconds. 6. Slowly let go of your leg and straighten it. Pelvic tilt Do these steps 5-10 times in a row: 1. Lie on your back on a firm bed or the floor with your legs stretched out. 2. Bend your knees so they point up to the ceiling. Your feet should be flat on the floor. 3. Tighten your lower belly (abdomen) muscles to press your lower back against the floor. This will make your tailbone point up to the ceiling instead of pointing down to your feet or the floor. 4. Stay in this position for 5-10 seconds while you gently tighten your muscles and breathe evenly. Cat-cow Do these steps until your lower back bends more easily: 1. Get on your hands and knees on a firm surface. Keep your hands under your shoulders, and keep your knees under your hips. You may put padding under your knees. 2. Let your head hang down toward your chest. Tighten (contract) the muscles in your belly. Point your tailbone toward the floor so your lower back becomes rounded like the back of a cat. 3. Stay in this position for 5 seconds. 4. Slowly lift your  head. Let the muscles of your belly relax. Point your tailbone up toward the ceiling so your back forms a sagging arch like the back of a cow. 5. Stay in this position for 5 seconds.   Press-ups Do these steps 5-10 times in a row: 1. Lie on your belly (face-down) on the floor. 2. Place your hands near your head, about shoulder-width apart. 3. While you keep your back relaxed and keep your hips on the floor, slowly straighten your arms to raise the top half of your body and lift your shoulders. Do not use your back muscles. You may change where you place your hands in order to make yourself more comfortable. 4. Stay in this position for 5 seconds. 5. Slowly return to lying flat on the floor.   Bridges Do these steps 10 times in a row: 1. Lie on your back on a firm surface. 2. Bend your knees so they point up to the ceiling. Your feet should be flat on the floor. Your arms should be flat at your sides, next to your body. 3. Tighten your butt muscles and lift your butt off the floor until your waist is almost as high as your knees. If you do not feel the muscles working in your butt and the back of your thighs, slide your feet   1-2 inches farther away from your butt. 4. Stay in this position for 3-5 seconds. 5. Slowly lower your butt to the floor, and let your butt muscles relax. If this exercise is too easy, try doing it with your arms crossed over your chest.   Belly crunches Do these steps 5-10 times in a row: 1. Lie on your back on a firm bed or the floor with your legs stretched out. 2. Bend your knees so they point up to the ceiling. Your feet should be flat on the floor. 3. Cross your arms over your chest. 4. Tip your chin a little bit toward your chest but do not bend your neck. 5. Tighten your belly muscles and slowly raise your chest just enough to lift your shoulder blades a tiny bit off of the floor. Avoid raising your body higher than that, because it can put too much stress on your  low back. 6. Slowly lower your chest and your head to the floor. Back lifts Do these steps 5-10 times in a row: 1. Lie on your belly (face-down) with your arms at your sides, and rest your forehead on the floor. 2. Tighten the muscles in your legs and your butt. 3. Slowly lift your chest off of the floor while you keep your hips on the floor. Keep the back of your head in line with the curve in your back. Look at the floor while you do this. 4. Stay in this position for 3-5 seconds. 5. Slowly lower your chest and your face to the floor. Contact a doctor if:  Your back pain gets a lot worse when you do an exercise.  Your back pain does not get better 2 hours after you exercise. If you have any of these problems, stop doing the exercises. Do not do them again unless your doctor says it is okay. Get help right away if:  You have sudden, very bad back pain. If this happens, stop doing the exercises. Do not do them again unless your doctor says it is okay. This information is not intended to replace advice given to you by your health care provider. Make sure you discuss any questions you have with your health care provider. Document Revised: 05/15/2018 Document Reviewed: 05/15/2018 Elsevier Patient Education  2021 Else[employer] Inc. Acute Back Pain, Adult Acute back pain is sudden and usually short-lived. It is often caused by an injury to the muscles and tissues in the back. The injury may result from:  A muscle or ligament getting overstretched or torn (strained). Ligaments are tissues that connect bones to each other. Lifting something improperly can cause a back strain.  Wear and tear (degeneration) of the spinal disks. Spinal disks are circular tissue that provide cushioning between the bones of the spine (vertebrae).  Twisting motions, such as while playing sports or doing yard work.  A hit to the back.  Arthritis. You may have a physical exam, lab tests, and imaging tests to find the  cause of your pain. Acute back pain usually goes away with rest and home care. Follow these instructions at home: Managing pain, stiffness, and swelling  Treatment may include medicines for pain and inflammation that are taken by mouth or applied to the skin, prescription pain medicine, or muscle relaxants. Take over-the-counter and prescription medicines only as told by your health care provider.  Your health care provider may recommend applying ice during the first 24-48 hours after your pain starts. To do this: ? Put ice in  a plastic bag. ? Place a towel between your skin and the bag. ? Leave the ice on for 20 minutes, 2-3 times a day.  If directed, apply heat to the affected area as often as told by your health care provider. Use the heat source that your health care provider recommends, such as a moist heat pack or a heating pad. ? Place a towel between your skin and the heat source. ? Leave the heat on for 20-30 minutes. ? Remove the heat if your skin turns bright red. This is especially important if you are unable to feel pain, heat, or cold. You have a greater risk of getting burned. Activity  Do not stay in bed. Staying in bed for more than 1-2 days can delay your recovery.  Sit up and stand up straight. Avoid leaning forward when you sit or hunching over when you stand. ? If you work at a desk, sit close to it so you do not need to lean over. Keep your chin tucked in. Keep your neck drawn back, and keep your elbows bent at a 90-degree angle (right angle). ? Sit high and close to the steering wheel when you drive. Add lower back (lumbar) support to your car seat, if needed.  Take short walks on even surfaces as soon as you are able. Try to increase the length of time you walk each day.  Do not sit, drive, or stand in one place for more than 30 minutes at a time. Sitting or standing for long periods of time can put stress on your back.  Do not drive or use heavy machinery while  taking prescription pain medicine.  Use proper lifting techniques. When you bend and lift, use positions that put less stress on your back: ? Gleed your knees. ? Keep the load close to your body. ? Avoid twisting.  Exercise regularly as told by your health care provider. Exercising helps your back heal faster and helps prevent back injuries by keeping muscles strong and flexible.  Work with a physical therapist to make a safe exercise program, as recommended by your health care provider. Do any exercises as told by your physical therapist.   Lifestyle  Maintain a healthy weight. Extra weight puts stress on your back and makes it difficult to have good posture.  Avoid activities or situations that make you feel anxious or stressed. Stress and anxiety increase muscle tension and can make back pain worse. Learn ways to manage anxiety and stress, such as through exercise. General instructions  Sleep on a firm mattress in a comfortable position. Try lying on your side with your knees slightly bent. If you lie on your back, put a pillow under your knees.  Follow your treatment plan as told by your health care provider. This may include: ? Cognitive or behavioral therapy. ? Acupuncture or massage therapy. ? Meditation or yoga. Contact a health care provider if:  You have pain that is not relieved with rest or medicine.  You have increasing pain going down into your legs or buttocks.  Your pain does not improve after 2 weeks.  You have pain at night.  You lose weight without trying.  You have a fever or chills. Get help right away if:  You develop new bowel or bladder control problems.  You have unusual weakness or numbness in your arms or legs.  You develop nausea or vomiting.  You develop abdominal pain.  You feel faint. Summary  Acute back pain is  sudden and usually short-lived.  Use proper lifting techniques. When you bend and lift, use positions that put less stress on  your back.  Take over-the-counter and prescription medicines and apply heat or ice as directed by your health care provider. This information is not intended to replace advice given to you by your health care provider. Make sure you discuss any questions you have with your health care provider. Document Revised: 05/13/2020 Document Reviewed: 05/13/2020 Elsevier Patient Education  2021 [employer].

## 2021-02-19 ENCOUNTER — Other Ambulatory Visit: Payer: Self-pay | Admitting: Family Medicine

## 2021-02-19 DIAGNOSIS — I10 Essential (primary) hypertension: Secondary | ICD-10-CM

## 2021-02-19 NOTE — Telephone Encounter (Signed)
Requested Prescriptions  Pending Prescriptions Disp Refills  . propranolol ER (INDERAL LA) 60 MG 24 hr capsule [Pharmacy Med Name: PROPRANOLOL ER 60 MG CAPSULE] 90 capsule 1    Sig: TAKE 1 CAPSULE BY MOUTH EVERY DAY     Cardiovascular:  Beta Blockers Passed - 02/19/2021  9:43 AM      Passed - Last BP in normal range    BP Readings from Last 1 Encounters:  01/09/21 129/84         Passed - Last Heart Rate in normal range    Pulse Readings from Last 1 Encounters:  01/09/21 80         Passed - Valid encounter within last 6 months    Recent Outpatient Visits          1 month ago Hypertension, unspecified type   [facility], Jodell Cipro, PA-C   4 months ago Hypertension, unspecified type   [facility], Jodell Cipro, PA-C   6 months ago Hypertension, unspecified type   [facility], Jodell Cipro, PA-C   6 months ago Hypertension, unspecified type   [facility], Jodell Cipro, [place]

## 2021-05-26 ENCOUNTER — Encounter: Payer: Self-pay | Admitting: Family Medicine

## 2021-06-29 ENCOUNTER — Encounter: Payer: Self-pay | Admitting: Family Medicine

## 2021-06-29 ENCOUNTER — Ambulatory Visit: Payer: BC Managed Care – PPO | Admitting: Family Medicine

## 2021-06-29 ENCOUNTER — Other Ambulatory Visit: Payer: Self-pay

## 2021-06-29 VITALS — BP 128/80 | HR 71 | Ht 72.0 in | Wt 257.0 lb

## 2021-06-29 DIAGNOSIS — I10 Essential (primary) hypertension: Secondary | ICD-10-CM

## 2021-06-29 DIAGNOSIS — F419 Anxiety disorder, unspecified: Secondary | ICD-10-CM

## 2021-06-29 DIAGNOSIS — Z23 Encounter for immunization: Secondary | ICD-10-CM | POA: Diagnosis not present

## 2021-06-29 DIAGNOSIS — F32A Depression, unspecified: Secondary | ICD-10-CM

## 2021-06-29 DIAGNOSIS — R400 Somnolence: Secondary | ICD-10-CM

## 2021-06-29 MED ORDER — PAROXETINE HCL 10 MG PO TABS: 10.0000 mg | ORAL_TABLET | Freq: Every day | ORAL | 0 refills | Status: DC

## 2021-06-29 NOTE — Assessment & Plan Note (Signed)
Depression screen [facility] 2/9 06/29/2021 08/01/2020  Decreased Interest 0 0  Down, Depressed, Hopeless 0 0  PHQ - 2 Score 0 0  Altered sleeping 3 2  Tired, decreased energy 3 2  Change in appetite 2 1  Feeling bad or failure about yourself  0 0  Trouble concentrating 1 1  Moving slowly or fidgety/restless 0 0  Suicidal thoughts 0 0  PHQ-9 Score 9 6  Difficult doing work/chores Not difficult at all Somewhat difficult   GAD 7 : Generalized Anxiety Score 06/29/2021  Nervous, Anxious, on Edge 1  Control/stop worrying 2  Worry too much - different things 2  Trouble relaxing 2  Restless 2  Easily annoyed or irritable 0  Afraid - awful might happen 0  Total GAD 7 Score 9  Anxiety Difficulty Somewhat difficult   Patient with stated longstanding history of anxiety/depression in the setting of sleep disturbance, specifically insomnia.  I reviewed various treatment strategies and he is amenable to initiation of paroxetine 10 mg daily, we will follow-up on his symptoms in 6 weeks time for his annual physical.  He was advised to contact her office for any issues between now and then.

## 2021-06-29 NOTE — Progress Notes (Signed)
Primary Care / Sports Medicine Office Visit  Patient Information:  Patient ID: Jeremiah Cameron, male DOB: June 30, 1991 Age: 30 y.o. MRN: [id number]   Jeremiah Cameron is a pleasant 30 y.o. male presenting with the following:  Chief Complaint  Patient presents with   Hypertension    Chronic. Taking propanolol. Takes BP at home with wrist cuff. Says his pressures are up and down.    New Patient (Initial Visit)   Insomnia    Difficulty sleeping. Sleeping around 7 hrs in a night. Been having trouble sleeping on and off for 1 year. More difficulty falling asleep.     Review of Systems pertinent details above   Patient Active Problem List   Diagnosis Date Noted   Anxiety and depression 06/29/2021   Daytime somnolence 06/29/2021   Primary hypertension 06/29/2021   Past Medical History:  Diagnosis Date   History of kidney stones    Substance abuse [facility])    Outpatient Encounter Medications as of 06/29/2021  Medication Sig   PARoxetine (PAXIL) 10 MG tablet Take 1 tablet (10 mg total) by mouth daily.   propranolol ER (INDERAL LA) 60 MG 24 hr capsule TAKE 1 CAPSULE BY MOUTH EVERY DAY   [DISCONTINUED] methocarbamol (ROBAXIN) 500 MG tablet Take 1 tablet (500 mg total) by mouth 4 (four) times daily. (Patient not taking: Reported on 06/29/2021)   [DISCONTINUED] omeprazole (PRILOSEC OTC) 20 MG tablet Take 1 tablet (20 mg total) by mouth daily.   [DISCONTINUED] predniSONE (DELTASONE) 10 MG tablet Taper down by 1 tablet by mouth daily starting at 6 day 1, then, 5 day 2, 4 day 3, 3 day 4, 2 day 5 and 1 day 6. Divide dosage among meals and bedtime each day.   No facility-administered encounter medications on file as of 06/29/2021.   Past Surgical History:  Procedure Laterality Date   TUMOR EXCISION     non cancerous    Vitals:   06/29/21 1037  BP: 128/80  Pulse: 71  SpO2: 99%   Vitals:   06/29/21 1037  Weight: 257 lb (116.6 kg)  Height: 6' (1.829 m)   Body mass index is  34.86 kg/m.  No results found.   Independent interpretation of notes and tests performed by another provider:   None  Procedures performed:   None  Pertinent History, Exam, Impression, and Recommendations:   Anxiety and depression Depression screen [facility] 2/9 06/29/2021 08/01/2020  Decreased Interest 0 0  Down, Depressed, Hopeless 0 0  PHQ - 2 Score 0 0  Altered sleeping 3 2  Tired, decreased energy 3 2  Change in appetite 2 1  Feeling bad or failure about yourself  0 0  Trouble concentrating 1 1  Moving slowly or fidgety/restless 0 0  Suicidal thoughts 0 0  PHQ-9 Score 9 6  Difficult doing work/chores Not difficult at all Somewhat difficult   GAD 7 : Generalized Anxiety Score 06/29/2021  Nervous, Anxious, on Edge 1  Control/stop worrying 2  Worry too much - different things 2  Trouble relaxing 2  Restless 2  Easily annoyed or irritable 0  Afraid - awful might happen 0  Total GAD 7 Score 9  Anxiety Difficulty Somewhat difficult   Patient with stated longstanding history of anxiety/depression in the setting of sleep disturbance, specifically insomnia.  I reviewed various treatment strategies and he is amenable to initiation of paroxetine 10 mg daily, we will follow-up on his symptoms in 6 weeks time for his annual  physical.  He was advised to contact her office for any issues between now and then.  Daytime somnolence Patient reported insomnia, daytime somnolence, mental fog in the setting of anxiety/depression.  That being said, he does relay history of being told that he snores, pauses in breathing, and nighttime awakening due to feeling short of air.  Examination reveals reassuring vital signs, benign cardiopulmonary findings, Mallampati 2.  I have reviewed with patient the concern for possible obstructive sleep apnea, the need for further evaluation and possible management by ENT/sleep medicine, and a referral was placed today in that regard.  We will follow  peripherally on this issue.  Primary hypertension Chronic, stable condition, well controlled on his current propranolol which may provide dual benefit for his comorbid anxiety concern.  Cardiopulmonary findings benign today.   Orders & Medications Meds ordered this encounter  Medications   PARoxetine (PAXIL) 10 MG tablet    Sig: Take 1 tablet (10 mg total) by mouth daily.    Dispense:  45 tablet    Refill:  0   Orders Placed This Encounter  Procedures   Flu Vaccine QUAD 50mo+IM (Fluarix, Fluzone & Alfiuria Quad PF)   Ambulatory referral to ENT     Return in about 6 weeks (around 08/10/2021).     Jeremiah Banana, MD   Primary Care Sports Medicine [facility]

## 2021-06-29 NOTE — Assessment & Plan Note (Addendum)
Patient reported insomnia, daytime somnolence, mental fog in the setting of anxiety/depression.  That being said, he does relay history of being told that he snores, pauses in breathing, and nighttime awakening due to feeling short of air.  Examination reveals reassuring vital signs, benign cardiopulmonary findings, Mallampati 2.  I have reviewed with patient the concern for possible obstructive sleep apnea, the need for further evaluation and possible management by ENT/sleep medicine, and a referral was placed today in that regard.  We will follow peripherally on this issue.

## 2021-06-29 NOTE — Patient Instructions (Signed)
-   Start paroxetine 10 mg daily - Continue propranolol daily - Referral coordinator will contact you in regards to ENT/sleep medicine follow-up - Review information provided on anxiety/depression - Return for follow-up in 6 weeks, contact her office for any dimensions between now and then

## 2021-06-29 NOTE — Assessment & Plan Note (Addendum)
Chronic, stable condition, well controlled on his current propranolol which may provide dual benefit for his comorbid anxiety concern.  Cardiopulmonary findings benign today.

## 2021-07-16 ENCOUNTER — Encounter: Payer: Self-pay | Admitting: Family Medicine

## 2021-08-05 ENCOUNTER — Other Ambulatory Visit: Payer: Self-pay | Admitting: Family Medicine

## 2021-08-05 DIAGNOSIS — F32A Depression, unspecified: Secondary | ICD-10-CM

## 2021-08-05 DIAGNOSIS — F419 Anxiety disorder, unspecified: Secondary | ICD-10-CM

## 2021-08-06 NOTE — Telephone Encounter (Signed)
Requested medication (s) are due for refill today: yes  Requested medication (s) are on the active medication list: yes  Last refill:  06/29/21 #45  Future visit scheduled: yes  Notes to clinic:  end date 08/13/21- please assess for refills    Requested Prescriptions  Pending Prescriptions Disp Refills   PARoxetine (PAXIL) 10 MG tablet [Pharmacy Med Name: PAROXETINE HCL 10 MG TABLET] 90 tablet 1    Sig: TAKE 1 TABLET BY MOUTH EVERY DAY     Psychiatry:  Antidepressants - SSRI Passed - 08/05/2021  8:32 AM      Passed - Completed PHQ-2 or PHQ-9 in the last 360 days      Passed - Valid encounter within last 6 months    Recent Outpatient Visits           1 month ago Anxiety and depression   [facility] [name], MD   6 months ago Hypertension, unspecified type   [facility] Chrismon, Jodell Cipro, PA-C   10 months ago Hypertension, unspecified type   [facility] Chrismon, Jodell Cipro, PA-C   11 months ago Hypertension, unspecified type   [employer], Jodell Cipro, PA-C   1 year ago Hypertension, unspecified type   [employer], Jodell Cipro, PA-C       Future Appointments             In 2 weeks Ashley Royalty, Ocie Bob, MD [facility]

## 2021-08-07 DIAGNOSIS — J392 Other diseases of pharynx: Secondary | ICD-10-CM | POA: Diagnosis not present

## 2021-08-07 DIAGNOSIS — H60549 Acute eczematoid otitis externa, unspecified ear: Secondary | ICD-10-CM | POA: Diagnosis not present

## 2021-08-10 ENCOUNTER — Ambulatory Visit: Payer: BC Managed Care – PPO | Admitting: Family Medicine

## 2021-08-16 ENCOUNTER — Other Ambulatory Visit: Payer: Self-pay

## 2021-08-16 ENCOUNTER — Telehealth: Payer: Self-pay | Admitting: Family Medicine

## 2021-08-16 DIAGNOSIS — I10 Essential (primary) hypertension: Secondary | ICD-10-CM

## 2021-08-16 MED ORDER — PROPRANOLOL HCL ER 60 MG PO CP24: ORAL_CAPSULE | ORAL | 0 refills | Status: DC

## 2021-08-16 NOTE — Telephone Encounter (Signed)
[facility] faxed refill request for the following medications:   propranolol ER (INDERAL LA) 60 MG 24 hr capsule  Please advise.

## 2021-08-24 ENCOUNTER — Other Ambulatory Visit: Payer: Self-pay

## 2021-08-24 ENCOUNTER — Encounter: Payer: Self-pay | Admitting: Family Medicine

## 2021-08-24 ENCOUNTER — Ambulatory Visit: Payer: BC Managed Care – PPO | Admitting: Family Medicine

## 2021-08-24 VITALS — BP 110/84 | HR 70 | Ht 72.0 in | Wt 274.0 lb

## 2021-08-24 DIAGNOSIS — F419 Anxiety disorder, unspecified: Secondary | ICD-10-CM | POA: Diagnosis not present

## 2021-08-24 DIAGNOSIS — M9901 Segmental and somatic dysfunction of cervical region: Secondary | ICD-10-CM | POA: Insufficient documentation

## 2021-08-24 DIAGNOSIS — F32A Depression, unspecified: Secondary | ICD-10-CM

## 2021-08-24 DIAGNOSIS — M940 Chondrocostal junction syndrome [Tietze]: Secondary | ICD-10-CM

## 2021-08-24 DIAGNOSIS — I10 Essential (primary) hypertension: Secondary | ICD-10-CM | POA: Diagnosis not present

## 2021-08-24 MED ORDER — PAROXETINE HCL 20 MG PO TABS: 20.0000 mg | ORAL_TABLET | Freq: Every day | ORAL | 1 refills | Status: DC

## 2021-08-24 MED ORDER — PROPRANOLOL HCL ER 60 MG PO CP24: ORAL_CAPSULE | ORAL | 0 refills | Status: DC

## 2021-08-24 NOTE — Patient Instructions (Addendum)
- Start paroxetine (Paxil) 20 mg tablets daily - Continue propranolol 60 mg capsules daily - Referral coordinator will contact you for following up with psychiatry - Recommend 30 minutes of moderate intensity exercise daily - Review information on mindfulness below -We will send exercises for costochondritis via MyChart - Return for follow-up in 6 weeks  Mindfulness-Based Stress Reduction Mindfulness-based stress reduction (MBSR) is a program that helps you learn to practice mindfulness. Mindfulness is the practice of intentionally paying attention to the present moment. MBSR focuses on developing self-awareness, which allows you to respond to life stress without judgment or negative emotions. It can be learned and practiced through techniques such as education, breathing exercises, meditation, and yoga. MBSR includes several mindfulness techniques in one program. MBSR works best when you understand the treatment, are willing to try new things, and can commit to spending time practicing what you learn.  What are the Benefits of MBSR? MBSR has been shown to have many benefits, which include helping you to: Reduce negative emotions, such as depression and anxiety. Improve memory and focus. Change how you sense and approach pain. Boost your body's ability to fight infections. Help you connect better with other people. Improve your sense of well-being. Follow These Instructions at Home:  Find a local in-person or online MBSR program. Set aside some time regularly for mindfulness practice. Find a mindfulness practice that works best for you. This may include one or more of the following: Meditation. Meditation involves focusing your mind on a certain thought or activity. Breathing awareness exercises. These help you to stay present by focusing on your breath. Body scan. For this practice, you lie down and pay attention to each part of your body from head to toe. You can identify tension and  soreness and intentionally relax parts of your body. Yoga. Yoga involves stretching and breathing, and it can improve your ability to move and be flexible. It can also provide an experience of testing your body's limits, which can help you release stress. Mindful eating. This way of eating involves focusing on the taste, texture, color, and smell of each bite of food. Because this slows down eating and helps you feel full sooner, it can be an important part of a weight-loss plan. Find a podcast or recording that provides guidance for breathing awareness, body scan, or meditation exercises. You can listen to these any time when you have a free moment to rest without distractions. Follow your treatment plan as told by your health care provider. This may include taking regular medicines and making changes to your diet or lifestyle as recommended. How to Practice Mindfulness: To do a basic awareness exercise: Find a comfortable place to sit or lay down. Pay attention to the present moment. Observe your thoughts, feelings, and surroundings just as they are. Avoid placing judgment on yourself, your feelings, or your surroundings. Make note of any judgment that comes up, and let it go. Your mind may wander, and that is okay. Make note of when your thoughts drift, and return your attention to the present moment. To do basic mindfulness meditation: Find a comfortable place to sit or lay down. This may include a stable chair, your bed, or a firm floor cushion. Sit upright with your back straight. Let your arms fall next to your side with your hands resting on your legs. If sitting in a chair, rest your feet flat on the floor. If sitting on a cushion, cross your legs in front of you. If laying  down, let your limbs fully relax. Keep your head in a neutral position with your chin dropped slightly. Relax your jaw and rest the tip of your tongue on the roof of your mouth. Drop your gaze to the floor. You can close  your eyes if you like. Breathe normally and pay attention to your breath. Feel the air moving in and out of your nose. Feel your belly expanding and relaxing with each breath. Your mind may wander, and that is okay. Make note of when your thoughts drift, and return your attention to your breath. Avoid placing judgment on yourself, your feelings, or your surroundings. Make note of any judgment or feelings that come up, let them go, and bring your attention back to your breath. When you are ready, lift your gaze or open your eyes. Pay attention to how your body feels after the meditation.

## 2021-08-24 NOTE — Assessment & Plan Note (Addendum)
Depression screen [facility] 2/9 08/24/2021 06/29/2021 08/01/2020  Decreased Interest 0 0 0  Down, Depressed, Hopeless 0 0 0  PHQ - 2 Score 0 0 0  Altered sleeping 1 3 2   Tired, decreased energy 1 3 2   Change in appetite 1 2 1   Feeling bad or failure about yourself  0 0 0  Trouble concentrating 1 1 1   Moving slowly or fidgety/restless 1 0 0  Suicidal thoughts 0 0 0  PHQ-9 Score 5 9 6   Difficult doing work/chores Not difficult at all Not difficult at all Somewhat difficult   GAD 7 : Generalized Anxiety Score 08/24/2021 06/29/2021  Nervous, Anxious, on Edge 1 1  Control/stop worrying 1 2  Worry too much - different things 1 2  Trouble relaxing 1 2  Restless 0 2  Easily annoyed or irritable 0 0  Afraid - awful might happen 2 0  Total GAD 7 Score 6 9  Anxiety Difficulty Somewhat difficult Somewhat difficult   Patient has demonstrated excellent interval response to initiation of paroxetine, he is still having sleep initiation issues, he attributes these to feelings of anxiety.  He does have home sleep study that has been ordered through ENT/sleep medicine that is pending.  Over the interim I have advised various treatment strategies to address his residual anxiety symptoms.  He is amenable to further evaluation by psychiatry for cognitive behavioral therapy, consideration of mindfulness and exercise therapy.  Lastly, we will increase his paroxetine to 20 mg daily.  He will return for follow-up in 6 weeks for reevaluation.

## 2021-08-24 NOTE — Progress Notes (Signed)
Primary Care / Sports Medicine Office Visit  Patient Information:  Patient ID: Jeremiah Cameron, male DOB: 01/18/1991 Age: 30 y.o. MRN: [id number]   Jeremiah Cameron is a pleasant 30 y.o. male presenting with the following:  Chief Complaint  Patient presents with   Anxiety   Depression    Patient Active Problem List   Diagnosis Date Noted   Costochondritis 08/24/2021   Anxiety and depression 06/29/2021   Daytime somnolence 06/29/2021   Hypertension 06/29/2021    Vitals:   08/24/21 1033  BP: 110/84  Pulse: 70  SpO2: 98%   Vitals:   08/24/21 1033  Weight: 274 lb (124.3 kg)  Height: 6' (1.829 m)   Body mass index is 37.16 kg/m.  No results found.   Independent interpretation of notes and tests performed by another provider:   None  Procedures performed:   None  Pertinent History, Exam, Impression, and Recommendations:   Anxiety and depression Depression screen [facility] 2/9 08/24/2021 06/29/2021 08/01/2020  Decreased Interest 0 0 0  Down, Depressed, Hopeless 0 0 0  PHQ - 2 Score 0 0 0  Altered sleeping 1 3 2   Tired, decreased energy 1 3 2   Change in appetite 1 2 1   Feeling bad or failure about yourself  0 0 0  Trouble concentrating 1 1 1   Moving slowly or fidgety/restless 1 0 0  Suicidal thoughts 0 0 0  PHQ-9 Score 5 9 6   Difficult doing work/chores Not difficult at all Not difficult at all Somewhat difficult   GAD 7 : Generalized Anxiety Score 08/24/2021 06/29/2021  Nervous, Anxious, on Edge 1 1  Control/stop worrying 1 2  Worry too much - different things 1 2  Trouble relaxing 1 2  Restless 0 2  Easily annoyed or irritable 0 0  Afraid - awful might happen 2 0  Total GAD 7 Score 6 9  Anxiety Difficulty Somewhat difficult Somewhat difficult   Patient has demonstrated excellent interval response to initiation of paroxetine, he is still having sleep initiation issues, he attributes these to feelings of anxiety.  He does have home sleep study  that has been ordered through ENT/sleep medicine that is pending.  Over the interim I have advised various treatment strategies to address his residual anxiety symptoms.  He is amenable to further evaluation by psychiatry for cognitive behavioral therapy, consideration of mindfulness and exercise therapy.  Lastly, we will increase his paroxetine to 20 mg daily.  He will return for follow-up in 6 weeks for reevaluation.  Costochondritis Newly described concern, ongoing for several weeks, atraumatic in onset, denies any change in activity, describes parasternal pain that is reproducible, occurs randomly alleviated by stretching that is often accompanied by a "pop" sensation that is nonpainful.  Physical examination reveals nontender parasternal regions at the costochondral junctions, benign cardiopulmonary findings.  I have advised him to monitor this issue, start home exercises with a focus on stretching and flexibility, he will return in 6 weeks for reevaluation.  We can follow-up on this issue if he is symptomatic.  Hypertension Chronic issue that is stable, vitals today are reassuring, has been tolerating propranolol well.  Cardiac findings reveal positive S1 and S2, regular rate and rhythm, no additional heart sounds, lung fields are clear throughout without wheezes, rales, rhonchi.  Plan to continue current dose of propranolol with 90 day supply provided.   Orders & Medications Meds ordered this encounter  Medications   PARoxetine (PAXIL) 20 MG tablet  Sig: Take 1 tablet (20 mg total) by mouth daily.    Dispense:  30 tablet    Refill:  1   propranolol ER (INDERAL LA) 60 MG 24 hr capsule    Sig: TAKE 1 CAPSULE BY MOUTH EVERY DAY    Dispense:  90 capsule    Refill:  0   Orders Placed This Encounter  Procedures   Ambulatory referral to Psychiatry     Return in about 6 weeks (around 10/05/2021).     [name], MD   Primary Care Sports Medicine [facility]

## 2021-08-24 NOTE — Assessment & Plan Note (Signed)
Chronic issue that is stable, vitals today are reassuring, has been tolerating propranolol well.  Cardiac findings reveal positive S1 and S2, regular rate and rhythm, no additional heart sounds, lung fields are clear throughout without wheezes, rales, rhonchi.  Plan to continue current dose of propranolol with 90 day supply provided.

## 2021-08-24 NOTE — Assessment & Plan Note (Signed)
Newly described concern, ongoing for several weeks, atraumatic in onset, denies any change in activity, describes parasternal pain that is reproducible, occurs randomly alleviated by stretching that is often accompanied by a "pop" sensation that is nonpainful.  Physical examination reveals nontender parasternal regions at the costochondral junctions, benign cardiopulmonary findings.  I have advised him to monitor this issue, start home exercises with a focus on stretching and flexibility, he will return in 6 weeks for reevaluation.  We can follow-up on this issue if he is symptomatic.

## 2021-09-12 DIAGNOSIS — G4730 Sleep apnea, unspecified: Secondary | ICD-10-CM | POA: Diagnosis not present

## 2021-09-13 ENCOUNTER — Other Ambulatory Visit: Payer: Self-pay | Admitting: Physician Assistant

## 2021-09-13 DIAGNOSIS — I10 Essential (primary) hypertension: Secondary | ICD-10-CM

## 2021-09-13 NOTE — Telephone Encounter (Signed)
Requested Prescriptions  Pending Prescriptions Disp Refills   propranolol ER (INDERAL LA) 60 MG 24 hr capsule [Pharmacy Med Name: PROPRANOLOL ER 60 MG CAPSULE] 30 capsule 0    Sig: TAKE 1 CAPSULE BY MOUTH EVERY DAY     Cardiovascular:  Beta Blockers Passed - 09/13/2021 12:33 PM      Passed - Last BP in normal range    BP Readings from Last 1 Encounters:  08/24/21 110/84         Passed - Last Heart Rate in normal range    Pulse Readings from Last 1 Encounters:  08/24/21 70         Passed - Valid encounter within last 6 months    Recent Outpatient Visits          2 weeks ago Anxiety and depression   [facility] [name], MD   2 months ago Anxiety and depression   [facility] [name], MD   8 months ago Hypertension, unspecified type   [facility] Chrismon, Jodell Cipro, PA-C   11 months ago Hypertension, unspecified type   [employer], Jodell Cipro, PA-C   1 year ago Hypertension, unspecified type   [employer], Jodell Cipro, PA-C      Future Appointments            In 3 weeks Ashley Royalty, Ocie Bob, MD [facility]

## 2021-09-15 ENCOUNTER — Other Ambulatory Visit: Payer: Self-pay | Admitting: Family Medicine

## 2021-09-15 DIAGNOSIS — F32A Depression, unspecified: Secondary | ICD-10-CM

## 2021-09-15 NOTE — Telephone Encounter (Signed)
Okay to send in 90day supply? 

## 2021-09-15 NOTE — Telephone Encounter (Signed)
Requested medication (s) are due for refill today: yes- Dx code needed  Requested medication (s) are on the active medication list: yes  Last refill:  08/24/21 #30 1 refills  Future visit scheduled: yes in 2 weeks  Notes to clinic:  Pharmacy comment: [place]. DX Code Needed.     Requested Prescriptions  Pending Prescriptions Disp Refills   PARoxetine (PAXIL) 20 MG tablet [Pharmacy Med Name: PAROXETINE HCL 20 MG TABLET] 90 tablet 1    Sig: TAKE 1 TABLET BY MOUTH EVERY DAY     Psychiatry:  Antidepressants - SSRI Passed - 09/15/2021  8:31 AM      Passed - Completed PHQ-2 or PHQ-9 in the last 360 days      Passed - Valid encounter within last 6 months    Recent Outpatient Visits           3 weeks ago Anxiety and depression   [facility] [name], MD   2 months ago Anxiety and depression   [place], Jason J, MD   8 months ago Hypertension, unspecified type   [place], PA-C   11 months ago Hypertension, unspecified type   [employer], Vickki Muff, PA-C   1 year ago Hypertension, unspecified type   [place], PA-C       Future Appointments             In 2 weeks Zigmund Daniel, Earley Abide, MD [facility], [place]

## 2021-10-05 ENCOUNTER — Ambulatory Visit: Payer: BC Managed Care – PPO | Admitting: Family Medicine

## 2021-10-05 ENCOUNTER — Encounter: Payer: Self-pay | Admitting: Family Medicine

## 2021-10-05 ENCOUNTER — Other Ambulatory Visit: Payer: Self-pay

## 2021-10-05 VITALS — BP 112/82 | HR 64 | Ht 72.0 in | Wt 275.0 lb

## 2021-10-05 DIAGNOSIS — M9901 Segmental and somatic dysfunction of cervical region: Secondary | ICD-10-CM | POA: Diagnosis not present

## 2021-10-05 DIAGNOSIS — I10 Essential (primary) hypertension: Secondary | ICD-10-CM | POA: Diagnosis not present

## 2021-10-05 DIAGNOSIS — F32A Depression, unspecified: Secondary | ICD-10-CM

## 2021-10-05 DIAGNOSIS — F419 Anxiety disorder, unspecified: Secondary | ICD-10-CM | POA: Diagnosis not present

## 2021-10-05 MED ORDER — PROPRANOLOL HCL ER 60 MG PO CP24: 60.0000 mg | ORAL_CAPSULE | Freq: Every day | ORAL | 2 refills | Status: DC

## 2021-10-05 NOTE — Patient Instructions (Signed)
-   Continue current medications - Start home exercises with information provided - Return for annual physical in 3 months - Contents for any questions, refills, etc

## 2021-10-05 NOTE — Assessment & Plan Note (Addendum)
Patient presents for follow-up to anxiety/depression, at his last visit on 08/24/2021 we increased his paroxetine to 20 mg daily, did discuss nonpharmacologic methods such as CBT, mindfulness, and exercise therapy.  Since that time he states that he has noted continued improvement, improved focus, and overall improved wellbeing.  This is despite mild increase in PHQ and GAD noted.  I discussed the option of titration, at this stage she is happy with the current dose and he will continue this until his return in 3 months.  He was advised to contact us for any issues over the interim.  Chronic condition, stable

## 2021-10-05 NOTE — Assessment & Plan Note (Signed)
Medication refill today

## 2021-10-05 NOTE — Assessment & Plan Note (Addendum)
Presents for follow-up to concern for costochondritis noted during his visit on 08/24/2021.  At that time he stated that he had noted atraumatic onset this ongoing for several weeks, attributed this to posture issues from prolonged sitting throughout the day.  At that visit he was advised home-based exercises and follow-up to assess response.  Unfortunately, patient was unable to start home exercises, instead has been "cracking" his chest with stretches in the chair with improvement though not resolution of symptoms.  No worsening or new symptoms in this area though he does bring up relatively newly noted occipital tightness.  He notices primarily at night when supine, he denies any radiation into the upper extremities, paresthesias, weakness.  Examination shows nontender parasternal regions at the costochondral junctions, noted cervical paraspinal spasm that is nontender and symmetric, negative Spurling's bilaterally, and baseline kyphotic posture, he has mild tightness reported though full and otherwise painless cervical range of motion.  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  I provided home-based exercises for patient to start, he is amenable to performing these.  If symptoms fail to adequately improve, he may benefit from formal physical therapy, cervical spine x-rays.  He can otherwise follow-up on as-needed basis for this issue and utilize OTC analgesic regimens.

## 2021-10-05 NOTE — Progress Notes (Signed)
°  ° °  Primary Care / Sports Medicine Office Visit  Patient Information:  Patient ID: Jeremiah Cameron, male DOB: 18-Feb-1991 Age: 31 y.o. MRN: [id number]   [name] is a pleasant 31 y.o. male presenting with the following:  Chief Complaint  Patient presents with   Anxiety and depression   Hypertension   Costochondritis    Vitals:   10/05/21 1014  BP: 112/82  Pulse: 64  SpO2: 98%   Vitals:   10/05/21 1014  Weight: 275 lb (124.7 kg)  Height: 6' (1.829 m)   Body mass index is 37.3 kg/m.  No results found.   Independent interpretation of notes and tests performed by another provider:   None  Procedures performed:   None  Pertinent History, Exam, Impression, and Recommendations:   Segmental and somatic dysfunction of cervical region Presents for follow-up to concern for costochondritis noted during his visit on 08/24/2021.  At that time he stated that he had noted atraumatic onset this ongoing for several weeks, attributed this to posture issues from prolonged sitting throughout the day.  At that visit he was advised home-based exercises and follow-up to assess response.  Unfortunately, patient was unable to start home exercises, instead has been "cracking" his chest with stretches in the chair with improvement though not resolution of symptoms.  No worsening or new symptoms in this area though he does bring up relatively newly noted occipital tightness.  He notices primarily at night when supine, he denies any radiation into the upper extremities, paresthesias, weakness.  Examination shows nontender parasternal regions at the costochondral junctions, noted cervical paraspinal spasm that is nontender and symmetric, negative Spurling's bilaterally, and baseline kyphotic posture, he has mild tightness reported though full and otherwise painless cervical range of motion.  Given his comorbid chest symptoms and reported occipital symptoms, his findings are most  consistent from relative deconditioning manifesting as upper crossed syndrome.  I provided home-based exercises for patient to start, he is amenable to performing these.  If symptoms fail to adequately improve, he may benefit from formal physical therapy, cervical spine x-rays.  He can otherwise follow-up on as-needed basis for this issue and utilize OTC analgesic regimens.  Anxiety and depression Patient presents for follow-up to anxiety/depression, at his last visit on 08/24/2021 we increased his paroxetine to 20 mg daily, did discuss nonpharmacologic methods such as CBT, mindfulness, and exercise therapy.  Since that time he states that he has noted continued improvement, improved focus, and overall improved wellbeing.  This is despite mild increase in PHQ and GAD noted.  I discussed the option of titration, at this stage she is happy with the current dose and he will continue this until his return in 3 months.  He was advised to contact us for any issues over the interim.  Chronic condition, stable  Hypertension Medication refill today   Orders & Medications Meds ordered this encounter  Medications   propranolol ER (INDERAL LA) 60 MG 24 hr capsule    Sig: Take 1 capsule (60 mg total) by mouth daily.    Dispense:  90 capsule    Refill:  2   No orders of the defined types were placed in this encounter.    Return in about 3 months (around 01/02/2022) for Annual physical.     [name], MD   [place]

## 2021-12-11 ENCOUNTER — Encounter: Payer: Self-pay | Admitting: Family Medicine

## 2021-12-11 NOTE — Telephone Encounter (Signed)
Appointment? Referral?

## 2021-12-25 ENCOUNTER — Other Ambulatory Visit: Payer: Self-pay | Admitting: Family Medicine

## 2021-12-25 DIAGNOSIS — I10 Essential (primary) hypertension: Secondary | ICD-10-CM

## 2021-12-26 NOTE — Telephone Encounter (Signed)
Requested Prescriptions  ?Pending Prescriptions Disp Refills  ?? propranolol ER (INDERAL LA) 60 MG 24 hr capsule [Pharmacy Med Name: PROPRANOLOL ER 60 MG CAPSULE] 30 capsule 0  ?  Sig: TAKE 1 CAPSULE BY MOUTH EVERY DAY  ?  ? Cardiovascular:  Beta Blockers Passed - 12/25/2021 11:07 AM  ?  ?  Passed - Last BP in normal range  ?  BP Readings from Last 1 Encounters:  ?10/05/21 112/82  ?   ?  ?  Passed - Last Heart Rate in normal range  ?  Pulse Readings from Last 1 Encounters:  ?10/05/21 64  ?   ?  ?  Passed - Valid encounter within last 6 months  ?  Recent Outpatient Visits   ?      ? 2 months ago Segmental and somatic dysfunction of cervical region  ? [facility] [name], MD  ? 4 months ago Anxiety and depression  ? [facility] [name], MD  ? 6 months ago Anxiety and depression  ? [place], MD  ? 11 months ago Hypertension, unspecified type  ? [name], PA-C  ? 1 year ago Hypertension, unspecified type  ? [place], PA-C  ?  ?  ?Future Appointments   ?        ? In 1 week [name], MD [facility] [place]  ?  ? ?  ?  ?  ? ? ?

## 2022-01-02 ENCOUNTER — Encounter: Payer: BC Managed Care – PPO | Admitting: Family Medicine

## 2022-01-30 ENCOUNTER — Other Ambulatory Visit: Payer: Self-pay | Admitting: Family Medicine

## 2022-01-30 DIAGNOSIS — I10 Essential (primary) hypertension: Secondary | ICD-10-CM

## 2022-01-31 NOTE — Telephone Encounter (Signed)
Requested medication (s) are due for refill today: yes  Requested medication (s) are on the active medication list: yes  Last refill:  12/26/21 #30 0 refills  Future visit scheduled: yes in 2 months  Notes to clinic:  do you want to continue refills?     Requested Prescriptions  Pending Prescriptions Disp Refills   propranolol ER (INDERAL LA) 60 MG 24 hr capsule [Pharmacy Med Name: PROPRANOLOL ER 60 MG CAPSULE] 30 capsule 0    Sig: TAKE 1 CAPSULE BY MOUTH EVERY DAY     Cardiovascular:  Beta Blockers Passed - 01/30/2022  9:47 AM      Passed - Last BP in normal range    BP Readings from Last 1 Encounters:  10/05/21 112/82         Passed - Last Heart Rate in normal range    Pulse Readings from Last 1 Encounters:  10/05/21 64         Passed - Valid encounter within last 6 months    Recent Outpatient Visits           3 months ago Segmental and somatic dysfunction of cervical region   [facility] [name], MD   5 months ago Anxiety and depression   [facility] [name], MD   7 months ago Anxiety and depression   [facility] [name], MD   1 year ago Hypertension, unspecified type   [facility] Chrismon, Jodell Cipro, PA-C   1 year ago Hypertension, unspecified type   [facility] Chrismon, Jodell Cipro, PA-C       Future Appointments             In 2 months Ashley Royalty, Ocie Bob, MD [facility]

## 2022-02-27 ENCOUNTER — Other Ambulatory Visit: Payer: Self-pay | Admitting: Family Medicine

## 2022-02-27 DIAGNOSIS — I10 Essential (primary) hypertension: Secondary | ICD-10-CM

## 2022-02-27 NOTE — Telephone Encounter (Signed)
Requested medication (s) are due for refill today:   Yes  Requested medication (s) are on the active medication list:   Yes  Future visit scheduled:   Yes 04/04/2022 with Dr.Matthews   Last ordered: 01/31/2022 #30, 0 refills  Returned for provider to review since courtesy supply has been given and appt isn't until 8/2.     Requested Prescriptions  Pending Prescriptions Disp Refills   propranolol ER (INDERAL LA) 60 MG 24 hr capsule [Pharmacy Med Name: PROPRANOLOL ER 60 MG CAPSULE] 30 capsule 0    Sig: TAKE 1 CAPSULE BY MOUTH EVERY DAY     Cardiovascular:  Beta Blockers Passed - 02/27/2022 11:35 AM      Passed - Last BP in normal range    BP Readings from Last 1 Encounters:  10/05/21 112/82         Passed - Last Heart Rate in normal range    Pulse Readings from Last 1 Encounters:  10/05/21 64         Passed - Valid encounter within last 6 months    Recent Outpatient Visits           4 months ago Segmental and somatic dysfunction of cervical region   [facility] [name], MD   6 months ago Anxiety and depression   [facility] [name], MD   8 months ago Anxiety and depression   [facility] [name], MD   1 year ago Hypertension, unspecified type   [facility] Chrismon, Jodell Cipro, PA-C   1 year ago Hypertension, unspecified type   [facility] Chrismon, Jodell Cipro, PA-C       Future Appointments             In 1 month Ashley Royalty, Ocie Bob, MD [facility]

## 2022-03-21 ENCOUNTER — Other Ambulatory Visit: Payer: Self-pay | Admitting: Family Medicine

## 2022-03-21 DIAGNOSIS — F419 Anxiety disorder, unspecified: Secondary | ICD-10-CM

## 2022-03-22 NOTE — Telephone Encounter (Signed)
Requested Prescriptions  Pending Prescriptions Disp Refills  . PARoxetine (PAXIL) 20 MG tablet [Pharmacy Med Name: PAROXETINE HCL 20 MG TABLET] 90 tablet 1    Sig: TAKE 1 TABLET BY MOUTH EVERY DAY     Psychiatry:  Antidepressants - SSRI Passed - 03/21/2022  3:21 AM      Passed - Completed PHQ-2 or PHQ-9 in the last 360 days      Passed - Valid encounter within last 6 months    Recent Outpatient Visits          5 months ago Segmental and somatic dysfunction of cervical region   [facility] [name], MD   7 months ago Anxiety and depression   [facility] [name], MD   8 months ago Anxiety and depression   [facility] [name], MD   1 year ago Hypertension, unspecified type   [facility] Chrismon, Jodell Cipro, PA-C   1 year ago Hypertension, unspecified type   [facility] Chrismon, Jodell Cipro, PA-C      Future Appointments            In 1 week Ashley Royalty, Ocie Bob, MD [facility]

## 2022-04-04 ENCOUNTER — Encounter: Payer: BC Managed Care – PPO | Admitting: Family Medicine

## 2022-06-16 ENCOUNTER — Other Ambulatory Visit: Payer: Self-pay | Admitting: Family Medicine

## 2022-06-16 DIAGNOSIS — F419 Anxiety disorder, unspecified: Secondary | ICD-10-CM

## 2022-06-18 NOTE — Telephone Encounter (Signed)
Courtesy refill - Patient will need an appointment for further refills. Requested Prescriptions  Pending Prescriptions Disp Refills  . PARoxetine (PAXIL) 20 MG tablet [Pharmacy Med Name: PAROXETINE HCL 20 MG TABLET] 30 tablet 0    Sig: TAKE 1 TABLET BY MOUTH EVERY DAY     Psychiatry:  Antidepressants - SSRI Failed - 06/16/2022  9:14 AM      Failed - Valid encounter within last 6 months    Recent Outpatient Visits          8 months ago Segmental and somatic dysfunction of cervical region   [facility] Primary Care and Sports Medicine at [facility], Earley Abide, MD   9 months ago Anxiety and depression   [place] [facility] Care and Sports Medicine at [facility], Earley Abide, MD   11 months ago Anxiety and depression   [place] Primary Care and Sports Medicine at [facility], Earley Abide, MD   1 year ago Hypertension, unspecified type   [place], Vickki Muff, PA-C   1 year ago Hypertension, unspecified type   [place], PA-C             Passed - Completed PHQ-2 or PHQ-9 in the last 360 days

## 2022-06-18 NOTE — Telephone Encounter (Signed)
Called pt - [id number] for appt.  

## 2022-06-28 IMAGING — CR DG CHEST 2V
2 series · 2 of 2 positions shown · non-contrast
Comparison: Two-view chest x-ray 07/24/2020

CLINICAL DATA: Chest pain.  Chest tightness.

EXAM:
CHEST - 2 VIEW

[chest pa]
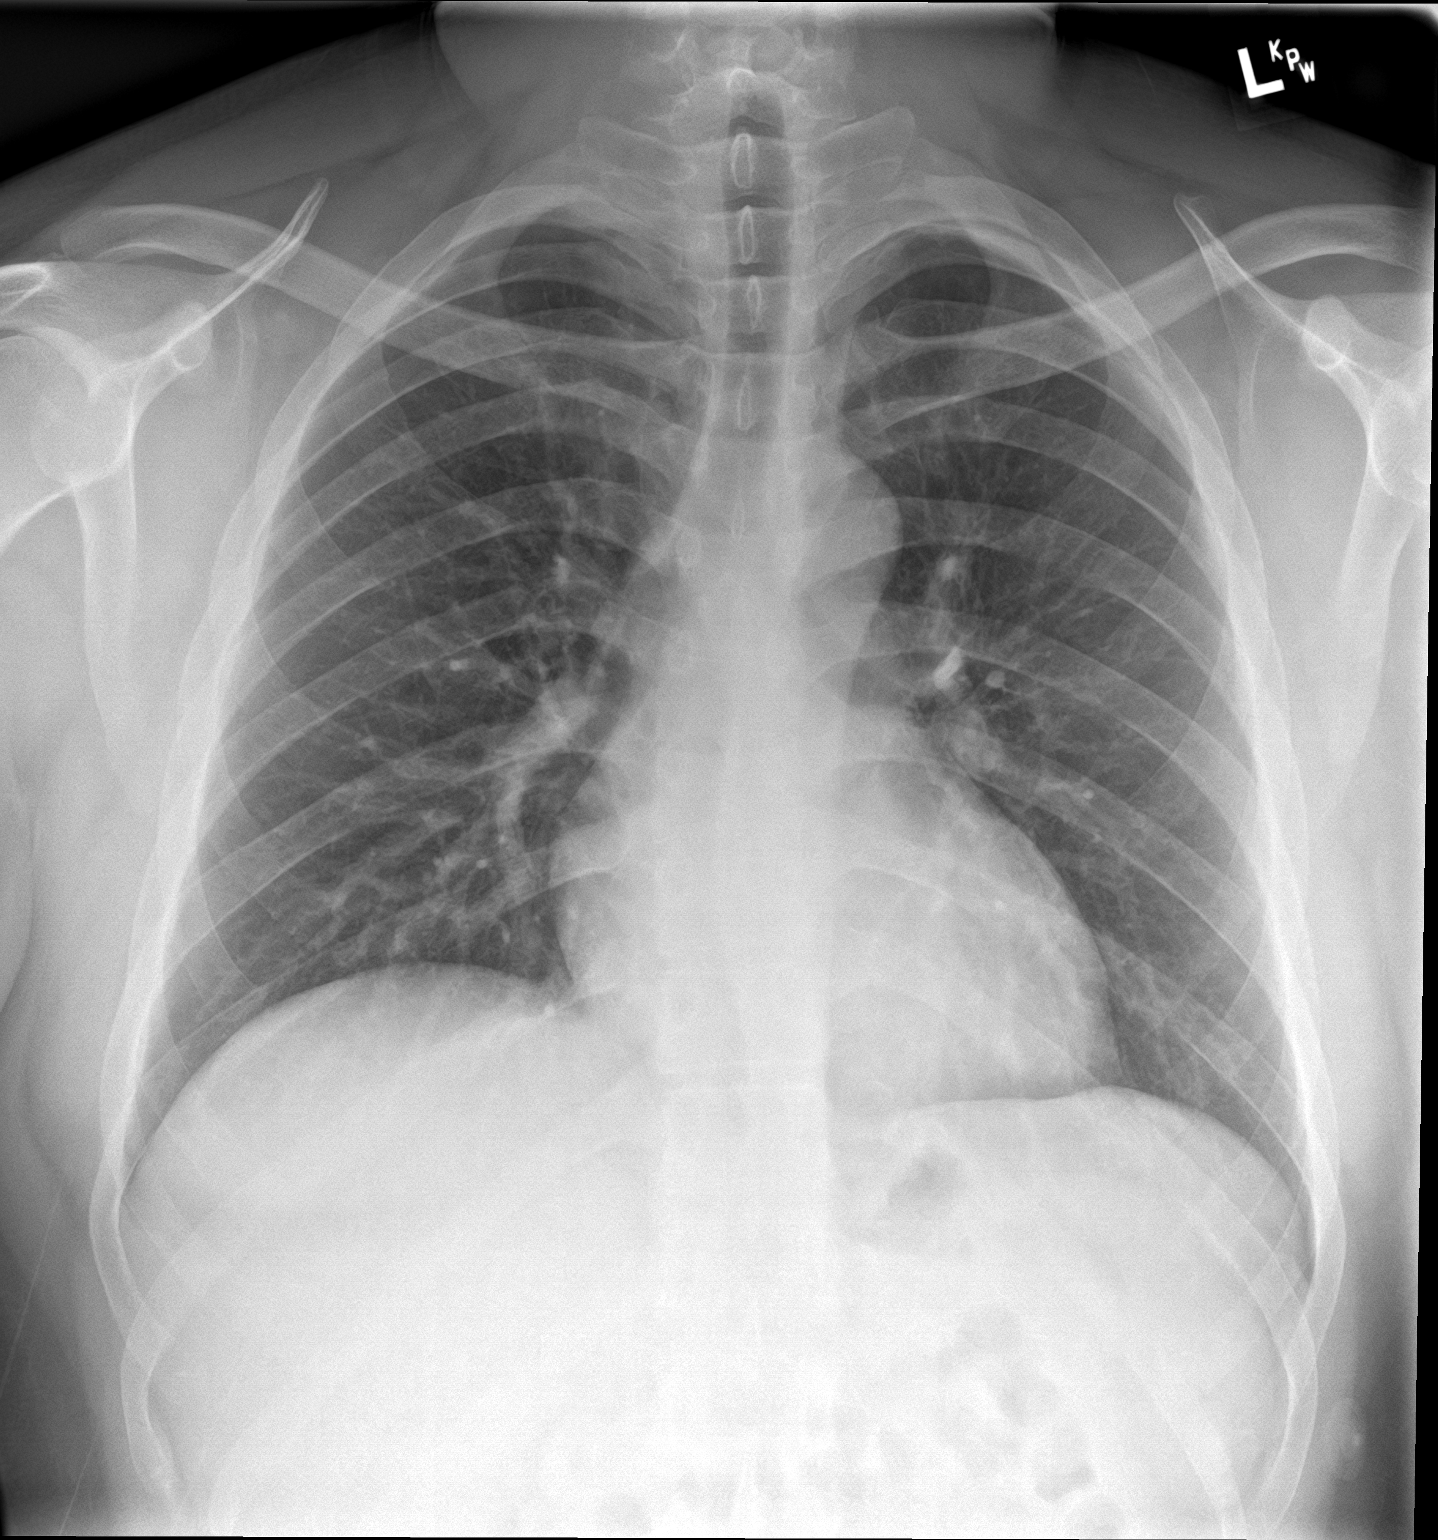

[chest lat]
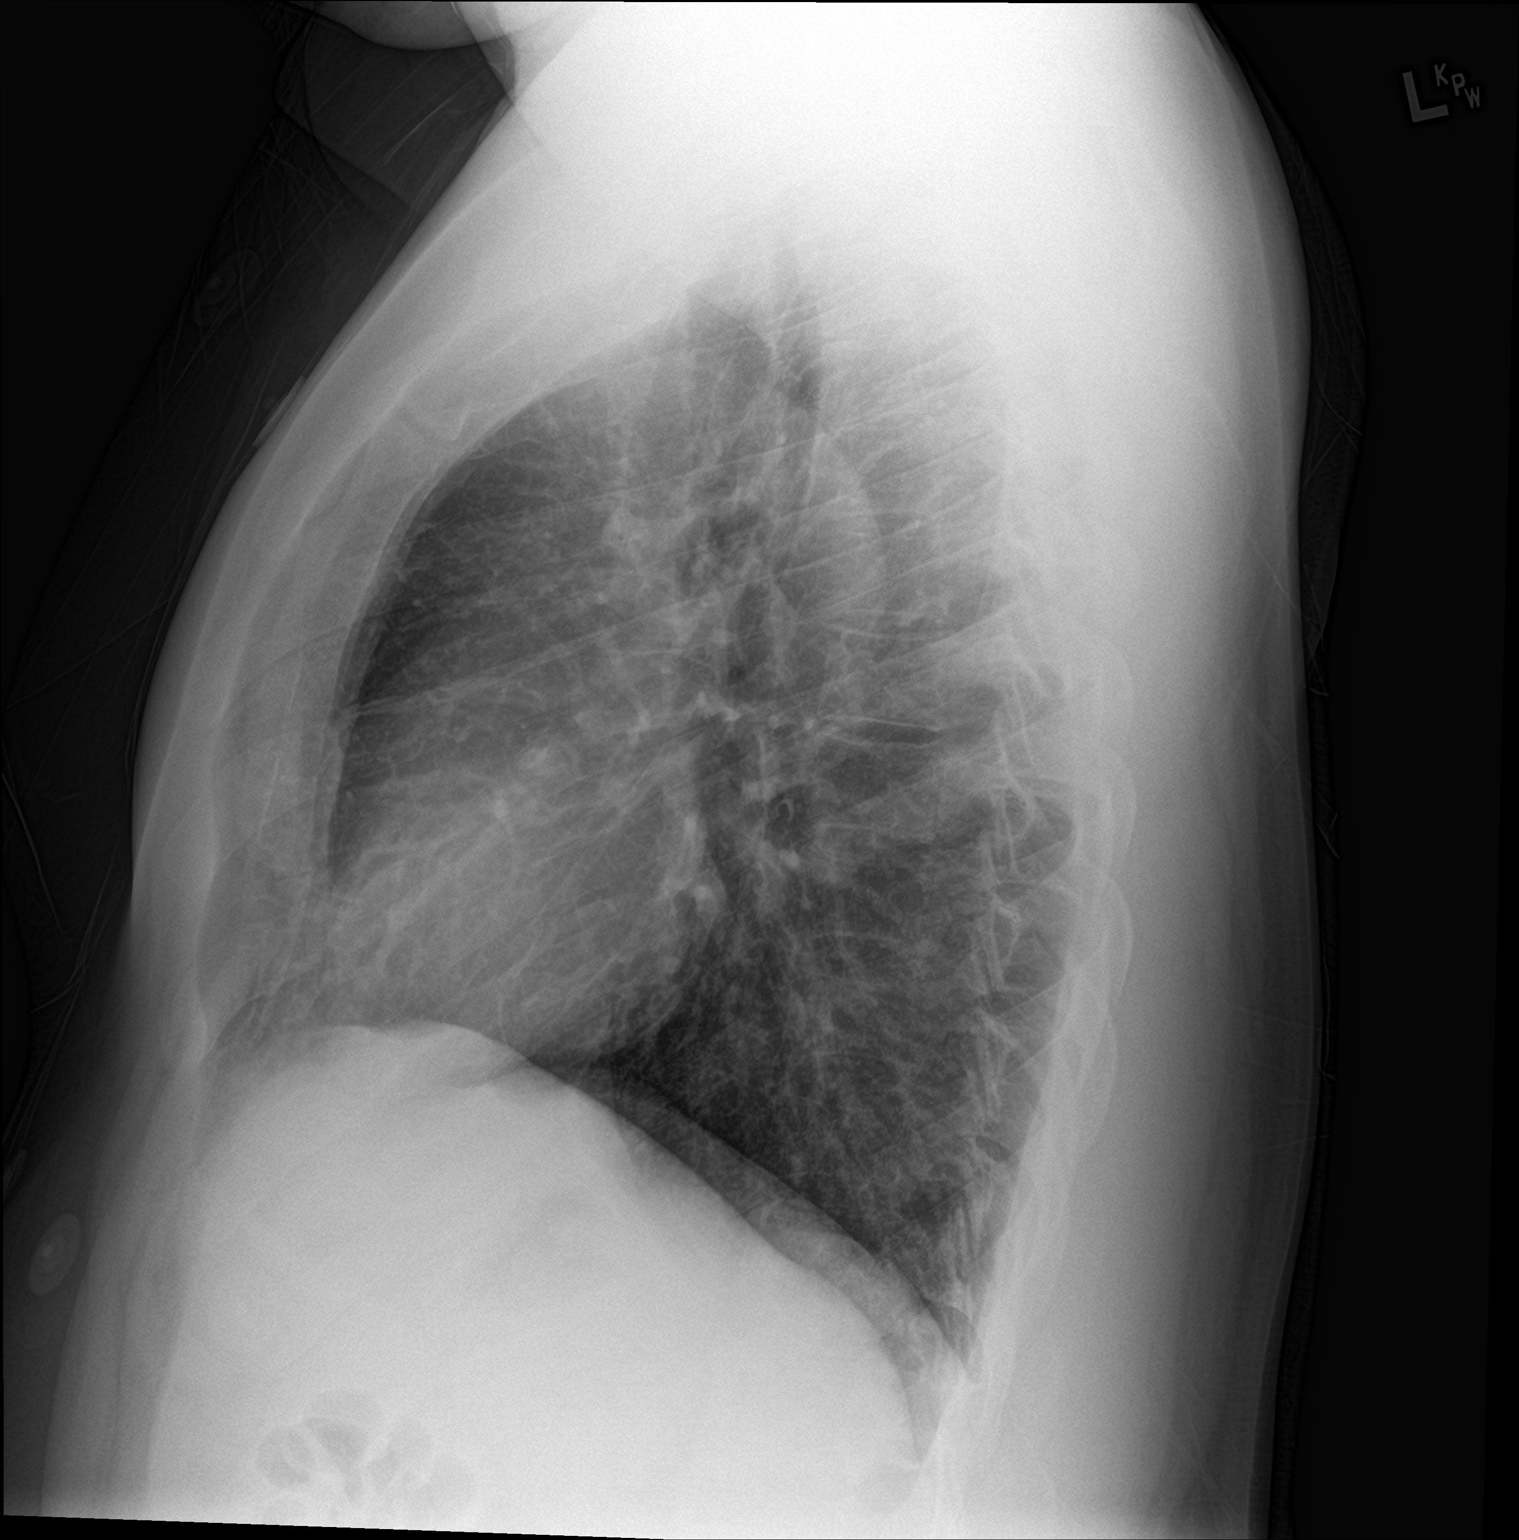

[2 of 2 positions shown; findings below may reference images not displayed]

FINDINGS: The heart size and mediastinal contours are within normal limits.
Both lungs are clear. The visualized skeletal structures are
unremarkable.
IMPRESSION: Negative two view chest x-ray

## 2022-07-13 ENCOUNTER — Other Ambulatory Visit: Payer: Self-pay | Admitting: Family Medicine

## 2022-07-13 DIAGNOSIS — F419 Anxiety disorder, unspecified: Secondary | ICD-10-CM

## 2022-07-13 NOTE — Telephone Encounter (Signed)
Requested medication (s) are due for refill today:yes  Requested medication (s) are on the active medication list: yes  Last refill:  06/18/22  Future visit scheduled: no  Notes to clinic:  Unable to refill per protocol, courtesy refill already given, routing for provider approval. REQUEST FOR 90 DAYS PRESCRIPTION. DX Code Needed.      Requested Prescriptions  Pending Prescriptions Disp Refills   PARoxetine (PAXIL) 20 MG tablet [Pharmacy Med Name: PAROXETINE HCL 20 MG TABLET] 90 tablet 1    Sig: TAKE 1 TABLET BY MOUTH EVERY DAY     Psychiatry:  Antidepressants - SSRI Failed - 07/13/2022  9:32 AM      Failed - Valid encounter within last 6 months    Recent Outpatient Visits           9 months ago Segmental and somatic dysfunction of cervical region   [facility] at [facility], Ocie Bob, MD   10 months ago Anxiety and depression   [place] Primary Care and Sports Medicine at [facility], Ocie Bob, MD   1 year ago Anxiety and depression   [place] [facility] and [facility] at [facility], Ocie Bob, MD   1 year ago Hypertension, unspecified type   [facility] Chrismon, Jodell Cipro, PA-C   1 year ago Hypertension, unspecified type   [facility] Chrismon, Jodell Cipro, PA-C              Passed - Completed PHQ-2 or PHQ-9 in the last 360 days

## 2022-07-17 ENCOUNTER — Other Ambulatory Visit: Payer: Self-pay | Admitting: Family Medicine

## 2022-07-17 DIAGNOSIS — F419 Anxiety disorder, unspecified: Secondary | ICD-10-CM

## 2022-07-17 NOTE — Telephone Encounter (Signed)
Left voice mail to set up medication refill appointment °

## 2022-07-17 NOTE — Telephone Encounter (Signed)
Requested medications are due for refill today.  yes  Requested medications are on the active medications list.  yes  Last refill. 06/18/2022 #30 0 rf  Future visit scheduled.   no  Notes to clinic.  Pt already given a courtesy refill.    Requested Prescriptions  Pending Prescriptions Disp Refills   PARoxetine (PAXIL) 20 MG tablet [Pharmacy Med Name: PAROXETINE HCL 20 MG TABLET] 30 tablet 0    Sig: TAKE 1 TABLET BY MOUTH EVERY DAY     Psychiatry:  Antidepressants - SSRI Failed - 07/17/2022  2:09 AM      Failed - Valid encounter within last 6 months    Recent Outpatient Visits           9 months ago Segmental and somatic dysfunction of cervical region   [facility] Primary Care and Sports Medicine at [facility], Ocie Bob, MD   10 months ago Anxiety and depression   [place] Primary Care and Sports Medicine at [facility], Ocie Bob, MD   1 year ago Anxiety and depression   [place] Primary Care and Sports Medicine at [facility], Ocie Bob, MD   1 year ago Hypertension, unspecified type   [facility] Chrismon, Jodell Cipro, PA-C   1 year ago Hypertension, unspecified type   [facility] Chrismon, Jodell Cipro, PA-C              Passed - Completed PHQ-2 or PHQ-9 in the last 360 days

## 2023-05-10 ENCOUNTER — Encounter: Payer: Self-pay | Admitting: Family Medicine

## 2023-05-10 ENCOUNTER — Ambulatory Visit (INDEPENDENT_AMBULATORY_CARE_PROVIDER_SITE_OTHER): Payer: BC Managed Care – PPO | Admitting: Family Medicine

## 2023-05-10 VITALS — BP 128/86 | HR 80 | Ht 72.0 in | Wt 270.0 lb

## 2023-05-10 DIAGNOSIS — F32A Depression, unspecified: Secondary | ICD-10-CM

## 2023-05-10 DIAGNOSIS — F419 Anxiety disorder, unspecified: Secondary | ICD-10-CM | POA: Diagnosis not present

## 2023-05-10 MED ORDER — HYDROXYZINE PAMOATE 25 MG PO CAPS: 25.0000 mg | ORAL_CAPSULE | Freq: Four times a day (QID) | ORAL | 1 refills | Status: DC | PRN

## 2023-05-10 MED ORDER — SERTRALINE HCL 50 MG PO TABS
50.0000 mg | ORAL_TABLET | Freq: Every day | ORAL | 0 refills | Status: DC
Start: 2023-05-10 — End: 2023-06-21

## 2023-05-31 NOTE — Progress Notes (Signed)
Primary Care / Sports Medicine Office Visit  Patient Information:  Patient ID: Jeremiah Cameron, male DOB: 07/12/1991 Age: 32 y.o. MRN: [id number]   Jeremiah Cameron is a pleasant 32 y.o. male presenting with the following:  Chief Complaint  Patient presents with   Establish Care    Vitals:   05/10/23 0810  BP: 128/86  Pulse: 80  SpO2: 97%   Vitals:   05/10/23 0810  Weight: 270 lb (122.5 kg)  Height: 6' (1.829 m)   Body mass index is 36.62 kg/m.  No results found.   Independent interpretation of notes and tests performed by another provider:   None  Procedures performed:   None  Pertinent History, Exam, Impression, and Recommendations:   Problem List Items Addressed This Visit       Other   Anxiety and depression - Primary    Recurrent symptoms, had previously been on SSRI. Is amenable to restart with different medication.  Plan: - Sertraline - Hydroxyine PRN      Relevant Medications   hydrOXYzine (VISTARIL) 25 MG capsule   sertraline (ZOLOFT) 50 MG tablet     Orders & Medications Medications:  Meds ordered this encounter  Medications   hydrOXYzine (VISTARIL) 25 MG capsule    Sig: Take 1-2 capsules (25-50 mg total) by mouth every 6 (six) hours as needed for anxiety.    Dispense:  30 capsule    Refill:  1   sertraline (ZOLOFT) 50 MG tablet    Sig: Take 1 tablet (50 mg total) by mouth daily.    Dispense:  90 tablet    Refill:  0   No orders of the defined types were placed in this encounter.    No follow-ups on file.     [name], MD, [facility]   Primary Care Sports Medicine Primary Care and Sports Medicine at [place]

## 2023-05-31 NOTE — Assessment & Plan Note (Signed)
Recurrent symptoms, had previously been on SSRI. Is amenable to restart with different medication.  Plan: - Sertraline - Hydroxyine PRN

## 2023-06-11 ENCOUNTER — Ambulatory Visit: Payer: Self-pay | Admitting: Family Medicine

## 2023-06-13 ENCOUNTER — Encounter: Payer: Self-pay | Admitting: Family Medicine

## 2023-06-21 ENCOUNTER — Encounter: Payer: Self-pay | Admitting: Family Medicine

## 2023-06-21 ENCOUNTER — Telehealth (INDEPENDENT_AMBULATORY_CARE_PROVIDER_SITE_OTHER): Payer: Self-pay | Admitting: Family Medicine

## 2023-06-21 VITALS — Ht 72.0 in

## 2023-06-21 DIAGNOSIS — F419 Anxiety disorder, unspecified: Secondary | ICD-10-CM

## 2023-06-21 DIAGNOSIS — F32A Depression, unspecified: Secondary | ICD-10-CM

## 2023-06-21 MED ORDER — SERTRALINE HCL 100 MG PO TABS
100.0000 mg | ORAL_TABLET | Freq: Every day | ORAL | 0 refills | Status: DC
Start: 2023-06-21 — End: 2023-09-23

## 2023-06-22 NOTE — Assessment & Plan Note (Signed)
Started sertraline, doing very well, no AE reported, still needing hydroxyzine. We discussed options such as further monitoring, non-pharmacologic adjunct steps, versus titration.  Plan: - Increase sertraline to 100 mg daily (new Rx sent in) - Can continue hydroxyzine as-needed - Return for physical

## 2023-06-22 NOTE — Progress Notes (Signed)
Primary Care / Sports Medicine Virtual Visit  Patient Information:  Patient ID: Jeremiah Cameron, male DOB: 11-04-1990 Age: 32 y.o. MRN: [id number]   Jeremiah Cameron is a pleasant 32 y.o. male presenting with the following:  Chief Complaint  Patient presents with   Depression    Medication is working well    Anxiety    Review of Systems: No fevers, chills, night sweats, weight loss, chest pain, or shortness of breath.   Patient Active Problem List   Diagnosis Date Noted   Segmental and somatic dysfunction of cervical region 08/24/2021   Anxiety and depression 06/29/2021   Daytime somnolence 06/29/2021   Hypertension 06/29/2021   Past Medical History:  Diagnosis Date   Anxiety    Depression    History of kidney stones    Hypertension    Substance abuse (HCC)    Outpatient Encounter Medications as of 06/21/2023  Medication Sig   hydrOXYzine (VISTARIL) 25 MG capsule Take 1-2 capsules (25-50 mg total) by mouth every 6 (six) hours as needed for anxiety.   sertraline (ZOLOFT) 100 MG tablet Take 1 tablet (100 mg total) by mouth daily.   [DISCONTINUED] sertraline (ZOLOFT) 50 MG tablet Take 1 tablet (50 mg total) by mouth daily.   No facility-administered encounter medications on file as of 06/21/2023.   Past Surgical History:  Procedure Laterality Date   TUMOR EXCISION     non cancerous    Virtual Visit via MyChart Video:   I connected with Jeremiah Cameron on 06/22/23 via MyChart Video and verified that I am speaking with the correct person using appropriate identifiers.   The limitations, risks, security and privacy concerns of performing an evaluation and management service by MyChart Video, including the higher likelihood of inaccurate diagnoses and treatments, and the availability of in person appointments were reviewed. The possible need of an additional face-to-face encounter for complete and high quality delivery of care was discussed. The patient was  also made aware that there may be a patient responsible charge related to this service. The patient expressed understanding and wishes to proceed.  Provider location is in medical facility. Patient location is at their home, different from provider location. People involved in care of the patient during this telehealth encounter were myself, my nurse/medical assistant, and my front office/scheduling team member.  Objective findings:   General: Speaking full sentences, no audible heavy breathing. Sounds alert and appropriately interactive. Well-appearing. Face symmetric. Extraocular movements intact. Pupils equal and round. No nasal flaring or accessory muscle use visualized.  Independent interpretation of notes and tests performed by another provider:   None  Pertinent History, Exam, Impression, and Recommendations:   Problem List Items Addressed This Visit       Other   Anxiety and depression - Primary    Started sertraline, doing very well, no AE reported, still needing hydroxyzine. We discussed options such as further monitoring, non-pharmacologic adjunct steps, versus titration.  Plan: - Increase sertraline to 100 mg daily (new Rx sent in) - Can continue hydroxyzine as-needed - Return for physical      Relevant Medications   sertraline (ZOLOFT) 100 MG tablet     Orders & Medications Medications:  Meds ordered this encounter  Medications   sertraline (ZOLOFT) 100 MG tablet    Sig: Take 1 tablet (100 mg total) by mouth daily.    Dispense:  90 tablet    Refill:  0   No orders of the defined types  were placed in this encounter.    I discussed the above assessment and treatment plan with the patient. The patient was provided an opportunity to ask questions and all were answered. The patient agreed with the plan and demonstrated an understanding of the instructions.   The patient was advised to call back or seek an in-person evaluation if the symptoms worsen or if the  condition fails to improve as anticipated.   I provided a total time of 30 minutes including both face-to-face and non-face-to-face time on 06/22/2023 inclusive of time utilized for medical chart review, information gathering, care coordination with staff, and documentation completion.    [name], MD, [facility]   Primary Care Sports Medicine Primary Care and Sports Medicine at [facility]

## 2023-06-22 NOTE — Patient Instructions (Signed)
-   Increase sertraline to 100 mg daily (new Rx sent in) - Can continue hydroxyzine as-needed - Return for physical

## 2023-07-30 ENCOUNTER — Encounter: Payer: BC Managed Care – PPO | Admitting: Family Medicine

## 2023-08-05 ENCOUNTER — Other Ambulatory Visit: Payer: Self-pay | Admitting: Family Medicine

## 2023-08-05 DIAGNOSIS — F32A Depression, unspecified: Secondary | ICD-10-CM

## 2023-08-07 NOTE — Telephone Encounter (Signed)
Requested by interface surescripts. Medication dose discontinued 06/21/23.  Requested Prescriptions  Refused Prescriptions Disp Refills   sertraline (ZOLOFT) 50 MG tablet [Pharmacy Med Name: SERTRALINE HCL 50 MG TABLET] 90 tablet 0    Sig: TAKE 1 TABLET BY MOUTH EVERY DAY     Psychiatry:  Antidepressants - SSRI - sertraline Failed - 08/05/2023  1:11 AM      Failed - AST in normal range and within 360 days    AST  Date Value Ref Range Status  09/29/2020 23 0 - 40 IU/L Final         Failed - ALT in normal range and within 360 days    ALT  Date Value Ref Range Status  09/29/2020 24 0 - 44 IU/L Final         Passed - Completed PHQ-2 or PHQ-9 in the last 360 days      Passed - Valid encounter within last 6 months    Recent Outpatient Visits           1 month ago Anxiety and depression   [place] Primary Care & Sports Medicine at [facility], Ocie Bob, MD   2 months ago Anxiety and depression   [place] Primary Care & Sports Medicine at [facility], Ocie Bob, MD   1 year ago Segmental and somatic dysfunction of cervical region   [facility] Primary Care & Sports Medicine at [facility], Ocie Bob, MD   1 year ago Anxiety and depression   [facility] Primary Care & [facility] Medicine at [facility], Ocie Bob, MD   2 years ago Anxiety and depression   [facility] Primary Care & Sports Medicine at [facility], Ocie Bob, MD       Future Appointments             In 2 months Ashley Royalty, Ocie Bob, MD [facility] Primary Care & Sports Medicine at [facility], [facility]

## 2023-09-21 ENCOUNTER — Other Ambulatory Visit: Payer: Self-pay | Admitting: Family Medicine

## 2023-09-21 DIAGNOSIS — F419 Anxiety disorder, unspecified: Secondary | ICD-10-CM

## 2023-09-23 NOTE — Telephone Encounter (Signed)
Requested medication (s) are due for refill today: yes  Requested medication (s) are on the active medication list: yes  Last refill:  06/21/23 #90   Future visit scheduled: yes  Notes to clinic:  overdue lab work   Requested Prescriptions  Pending Prescriptions Disp Refills   sertraline (ZOLOFT) 100 MG tablet [Pharmacy Med Name: SERTRALINE HCL 100 MG TABLET] 90 tablet 0    Sig: TAKE 1 TABLET BY MOUTH EVERY DAY     Psychiatry:  Antidepressants - SSRI - sertraline Failed - 09/23/2023 10:17 AM      Failed - AST in normal range and within 360 days    AST  Date Value Ref Range Status  09/29/2020 23 0 - 40 IU/L Final         Failed - ALT in normal range and within 360 days    ALT  Date Value Ref Range Status  09/29/2020 24 0 - 44 IU/L Final         Passed - Completed PHQ-2 or PHQ-9 in the last 360 days      Passed - Valid encounter within last 6 months    Recent Outpatient Visits           3 months ago Anxiety and depression   [place] Primary Care & Sports Medicine at [facility] Ashley Royalty, Ocie Bob, MD   4 months ago Anxiety and depression   [place] Primary Care & Sports Medicine at [facility], Ocie Bob, MD   1 year ago Segmental and somatic dysfunction of cervical region   [facility] Primary Care & Sports Medicine at [facility], Ocie Bob, MD   2 years ago Anxiety and depression   [facility] Care [facility] at [facility], Ocie Bob, MD   2 years ago Anxiety and depression   [facility] Primary Care & Sports Medicine at [facility], Ocie Bob, MD       Future Appointments             In 1 month Ashley Royalty, Ocie Bob, MD [facility] Primary Care & Sports Medicine at [facility], [facility]

## 2023-10-30 ENCOUNTER — Ambulatory Visit (INDEPENDENT_AMBULATORY_CARE_PROVIDER_SITE_OTHER): Payer: BC Managed Care – PPO | Admitting: Family Medicine

## 2023-10-30 ENCOUNTER — Encounter: Payer: Self-pay | Admitting: Family Medicine

## 2023-10-30 VITALS — BP 124/80 | HR 76 | Ht 72.0 in | Wt 284.8 lb

## 2023-10-30 DIAGNOSIS — F32A Depression, unspecified: Secondary | ICD-10-CM

## 2023-10-30 DIAGNOSIS — Z1159 Encounter for screening for other viral diseases: Secondary | ICD-10-CM

## 2023-10-30 DIAGNOSIS — Z23 Encounter for immunization: Secondary | ICD-10-CM

## 2023-10-30 DIAGNOSIS — F419 Anxiety disorder, unspecified: Secondary | ICD-10-CM

## 2023-10-30 DIAGNOSIS — Z0000 Encounter for general adult medical examination without abnormal findings: Secondary | ICD-10-CM | POA: Diagnosis not present

## 2023-10-30 DIAGNOSIS — L309 Dermatitis, unspecified: Secondary | ICD-10-CM

## 2023-10-30 DIAGNOSIS — R400 Somnolence: Secondary | ICD-10-CM

## 2023-10-30 DIAGNOSIS — M9901 Segmental and somatic dysfunction of cervical region: Secondary | ICD-10-CM

## 2023-10-30 DIAGNOSIS — R7309 Other abnormal glucose: Secondary | ICD-10-CM

## 2023-10-30 DIAGNOSIS — Z136 Encounter for screening for cardiovascular disorders: Secondary | ICD-10-CM | POA: Diagnosis not present

## 2023-10-30 DIAGNOSIS — G479 Sleep disorder, unspecified: Secondary | ICD-10-CM

## 2023-10-30 MED ORDER — HYDROXYZINE PAMOATE 25 MG PO CAPS: 25.0000 mg | ORAL_CAPSULE | Freq: Four times a day (QID) | ORAL | 1 refills | Status: AC | PRN

## 2023-10-30 NOTE — Progress Notes (Signed)
 Annual Physical Exam Visit  Patient Information:  Patient ID: Jeremiah Cameron, male DOB: 1990/10/09 Age: 33 y.o. MRN: [id number]   Subjective:   CC: Annual Physical Exam  HPI:  Jeremiah Cameron is here for their annual physical.  I reviewed the past medical history, family history, social history, surgical history, and allergies today and changes were made as necessary.  Please see the problem list section below for additional details.  Past Medical History: Past Medical History:  Diagnosis Date   Anxiety    Depression    History of kidney stones    Hypertension    Substance abuse (HCC)    Past Surgical History: Past Surgical History:  Procedure Laterality Date   TUMOR EXCISION     non cancerous   Family History: Family History  Problem Relation Age of Onset   Hypertension Mother    Diabetes Maternal Grandmother    Dementia Maternal Grandmother    Cancer Maternal Grandfather    Allergies: Allergies  Allergen Reactions   Shellfish Allergy Swelling   Health Maintenance: Health Maintenance  Topic Date Due   INFLUENZA VACCINE  12/02/2023 (Originally 04/04/2023)   DTaP/Tdap/Td (2 - Td or Tdap) 10/29/2033   COVID-19 Vaccine  Completed   Hepatitis C Screening  Completed   HIV Screening  Completed   HPV VACCINES  Aged Out    HM Colonoscopy   This patient has no relevant Health Maintenance data.    Medications: Current Outpatient Medications on File Prior to Visit  Medication Sig Dispense Refill   sertraline (ZOLOFT) 100 MG tablet TAKE 1 TABLET BY MOUTH EVERY DAY 90 tablet 0   No current facility-administered medications on file prior to visit.    Objective:   Vitals:   10/30/23 0906  BP: 124/80  Pulse: 76  SpO2: 96%   Vitals:   10/30/23 0906  Weight: 284 lb 12.8 oz (129.2 kg)  Height: 6' (1.829 m)   Body mass index is 38.63 kg/m.  General: Well Developed, well nourished, and in no acute distress.  Neuro: Alert and oriented x3,  extra-ocular muscles intact, sensation grossly intact. Cranial nerves II through XII are grossly intact, motor, sensory, and coordinative functions are intact. HEENT: Normocephalic, atraumatic, neck supple, no masses, no lymphadenopathy, thyroid nonenlarged. Oropharynx, nasopharynx, external ear canals are unremarkable. Skin: Warm and dry, no rashes noted.  Cardiac: Regular rate and rhythm, no murmurs rubs or gallops. No peripheral edema. Pulses symmetric. Respiratory: Clear to auscultation bilaterally. Speaking in full sentences.  Abdominal: Soft, nontender, nondistended, positive bowel sounds, no masses, no organomegaly. Musculoskeletal: Stable, and with full range of motion.  Impression and Recommendations:   The patient was counselled, risk factors were discussed, and anticipatory guidance given.  Problem List Items Addressed This Visit     Anxiety and depression   He manages stress with sertraline 100 mg daily, experiencing heightened stress episodes less than once a month. During these episodes, he uses Vistaril as needed, which he finds helpful.  Stress Management Reports good control with Sertraline 100mg  daily. Infrequent episodes of acute stress, less than once a month, managed with Vistaril as needed. -Continue Sertraline 100mg  daily. -Continue Vistaril as needed for acute stress episodes. -Refill prescriptions.      Relevant Medications   hydrOXYzine (VISTARIL) 25 MG capsule   Daytime somnolence   Relevant Orders   Ambulatory referral to Pulmonology   Dermatitis of external ear   He reports itching in his left ear, attributing it to  seasonal changes. He uses open-back headphones to avoid moisture build-up, which has been beneficial in the past.  Ear Irritation Reports itching in left ear, physical exam shows redness in right ear canal. -Try over-the-counter allergy medication or hypoallergenic lotion for external ear irritation. -Advise to take breaks when using  over-ear headphones to prevent moisture build-up.      Healthcare maintenance - Primary   Relevant Orders   CBC (Completed)   Tdap vaccine greater than or equal to 7yo IM (Completed)   [employer] Comirnaty Covid-19 Vaccine 56yrs & older (Completed)   Segmental and somatic dysfunction of cervical region   He experiences stiffness in the back of his neck, which he manages with regular stretching exercises both at work and at home. These exercises are helpful in alleviating his symptoms.  Neck Stiffness Reports mild stiffness in the back of the neck, managed with stretching exercises at work and home. -Provide additional exercises for neck and shoulder strengthening and flexibility.      Sleep disturbances   He experiences occasional sleep difficulties, attributing them to work-related stress. He recalls a previous encounter with a sleep medicine specialist in 2022, where there was a mix-up with his sleep study results, and he has not received further follow-up since then.  Sleep Disturbances Reports variable sleep quality, possibly related to work schedule. Previous referral to ENT for sleep study in 2022, but no follow-up due to administrative error. -Refer to pulmonary sleep for sleep medicine consultation.      Relevant Orders   Ambulatory referral to Pulmonology   Other Visit Diagnoses       Screening for cardiovascular condition       Relevant Orders   Comprehensive metabolic panel (Completed)   Lipid panel (Completed)     Screening for viral disease       Relevant Orders   Hepatitis C antibody (Completed)   HIV Antibody (routine testing w rflx) (Completed)     Abnormal glucose       Relevant Orders   Hemoglobin A1c (Completed)        Orders & Medications Medications:  Meds ordered this encounter  Medications   hydrOXYzine (VISTARIL) 25 MG capsule    Sig: Take 1-2 capsules (25-50 mg total) by mouth every 6 (six) hours as needed for anxiety.    Dispense:  30 capsule     Refill:  1   Orders Placed This Encounter  Procedures   Tdap vaccine greater than or equal to 7yo IM   [employer] Covid-19 Vaccine 26yrs & older   CBC   Comprehensive metabolic panel   Hemoglobin A1c   Hepatitis C antibody   HIV Antibody (routine testing w rflx)   Lipid panel   Ambulatory referral to Pulmonology     Return in about 1 year (around 10/29/2024) for CPE.    [name], MD, [facility]   Primary Care Sports Medicine Primary Care and Sports Medicine at [facility]

## 2023-10-31 LAB — CBC
Hematocrit: 42.5 % (ref 37.5–51.0)
Hemoglobin: 13.7 g/dL (ref 13.0–17.7)
MCH: 25.1 pg — ABNORMAL LOW (ref 26.6–33.0)
MCHC: 32.2 g/dL (ref 31.5–35.7)
MCV: 78 fL — ABNORMAL LOW (ref 79–97)
Platelets: 327 10*3/uL (ref 150–450)
RBC: 5.45 x10E6/uL (ref 4.14–5.80)
RDW: 12.9 % (ref 11.6–15.4)
WBC: 9.9 10*3/uL (ref 3.4–10.8)

## 2023-10-31 LAB — COMPREHENSIVE METABOLIC PANEL
ALT: 21 [IU]/L (ref 0–44)
AST: 30 [IU]/L (ref 0–40)
Albumin: 4.9 g/dL (ref 4.1–5.1)
Alkaline Phosphatase: 97 [IU]/L (ref 44–121)
BUN/Creatinine Ratio: 14 (ref 9–20)
BUN: 17 mg/dL (ref 6–20)
Bilirubin Total: 0.3 mg/dL (ref 0.0–1.2)
CO2: 21 mmol/L (ref 20–29)
Calcium: 9.6 mg/dL (ref 8.7–10.2)
Chloride: 103 mmol/L (ref 96–106)
Creatinine, Ser: 1.18 mg/dL (ref 0.76–1.27)
Globulin, Total: 3 g/dL (ref 1.5–4.5)
Glucose: 74 mg/dL (ref 70–99)
Potassium: 4.4 mmol/L (ref 3.5–5.2)
Sodium: 143 mmol/L (ref 134–144)
Total Protein: 7.9 g/dL (ref 6.0–8.5)
eGFR: 84 mL/min/{1.73_m2} (ref >59)

## 2023-10-31 LAB — HEPATITIS C ANTIBODY: Hep C Virus Ab: NONREACTIVE

## 2023-10-31 LAB — HEMOGLOBIN A1C
Est. average glucose Bld gHb Est-mCnc: 114 mg/dL
Hgb A1c MFr Bld: 5.6 % (ref 4.8–5.6)

## 2023-10-31 LAB — LIPID PANEL
Chol/HDL Ratio: 4.9 {ratio} (ref 0.0–5.0)
Cholesterol, Total: 200 mg/dL — ABNORMAL HIGH (ref 100–199)
HDL: 41 mg/dL (ref >39)
LDL Chol Calc (NIH): 134 mg/dL — ABNORMAL HIGH (ref 0–99)
Triglycerides: 136 mg/dL (ref 0–149)
VLDL Cholesterol Cal: 25 mg/dL (ref 5–40)

## 2023-10-31 LAB — HIV ANTIBODY (ROUTINE TESTING W REFLEX): HIV Screen 4th Generation wRfx: NONREACTIVE

## 2023-11-11 DIAGNOSIS — Z0000 Encounter for general adult medical examination without abnormal findings: Secondary | ICD-10-CM | POA: Insufficient documentation

## 2023-11-11 DIAGNOSIS — G479 Sleep disorder, unspecified: Secondary | ICD-10-CM | POA: Insufficient documentation

## 2023-11-11 DIAGNOSIS — L309 Dermatitis, unspecified: Secondary | ICD-10-CM | POA: Insufficient documentation

## 2023-11-11 NOTE — Assessment & Plan Note (Addendum)
 He experiences occasional sleep difficulties, attributing them to work-related stress. He recalls a previous encounter with a sleep medicine specialist in 2022, where there was a mix-up with his sleep study results, and he has not received further follow-up since then.  Sleep Disturbances Reports variable sleep quality, possibly related to work schedule. Previous referral to ENT for sleep study in 2022, but no follow-up due to administrative error. -Refer to pulmonary sleep for sleep medicine consultation.

## 2023-11-11 NOTE — Assessment & Plan Note (Signed)
 He manages stress with sertraline 100 mg daily, experiencing heightened stress episodes less than once a month. During these episodes, he uses Vistaril as needed, which he finds helpful.  Stress Management Reports good control with Sertraline 100mg  daily. Infrequent episodes of acute stress, less than once a month, managed with Vistaril as needed. -Continue Sertraline 100mg  daily. -Continue Vistaril as needed for acute stress episodes. -Refill prescriptions.

## 2023-11-11 NOTE — Assessment & Plan Note (Signed)
 He reports itching in his left ear, attributing it to seasonal changes. He uses open-back headphones to avoid moisture build-up, which has been beneficial in the past.  Ear Irritation Reports itching in left ear, physical exam shows redness in right ear canal. -Try over-the-counter allergy medication or hypoallergenic lotion for external ear irritation. -Advise to take breaks when using over-ear headphones to prevent moisture build-up.

## 2023-11-11 NOTE — Assessment & Plan Note (Signed)
 He experiences stiffness in the back of his neck, which he manages with regular stretching exercises both at work and at home. These exercises are helpful in alleviating his symptoms.  Neck Stiffness Reports mild stiffness in the back of the neck, managed with stretching exercises at work and home. -Provide additional exercises for neck and shoulder strengthening and flexibility.

## 2023-11-11 NOTE — Patient Instructions (Signed)
-   Obtain fasting labs with orders provided (can have water or black coffee but otherwise no food or drink x 8 hours before labs) - Review information provided - Attend eye doctor annually, dentist every 6 months, work towards or maintain 30 minutes of moderate intensity physical activity at least 5 days per week, and consume a balanced diet - Return in 1 year for physical - Contact us for any questions between now and then    Patient Action Plan  Anxiety and Stress Management: 1. Continue taking Sertraline 100 mg daily for anxiety and depression. 2. Use Vistaril as needed for acute stress episodes. 3. Ensure prescription refills for both medications.  Ear Irritation: 1. Use over-the-counter allergy medication or hypoallergenic lotion for ear irritation. 2. Take breaks from over-ear headphones to prevent moisture build-up.  Neck Stiffness: 1. Continue regular stretching exercises for the neck. 2. Incorporate additional exercises to strengthen and improve flexibility in the neck and shoulders.  Sleep Disturbances: 1. Follow up with a referral to pulmonary sleep for a sleep medicine consultation.  Red Flags: - Contact the office if anxiety or depression symptoms worsen, or if there is a significant change in sleep patterns or ear irritation.

## 2023-11-20 ENCOUNTER — Encounter: Payer: Self-pay | Admitting: Family Medicine

## 2023-12-20 ENCOUNTER — Other Ambulatory Visit: Payer: Self-pay | Admitting: Family Medicine

## 2023-12-20 DIAGNOSIS — F419 Anxiety disorder, unspecified: Secondary | ICD-10-CM

## 2023-12-20 NOTE — Telephone Encounter (Signed)
 Requested Prescriptions  Pending Prescriptions Disp Refills   sertraline  (ZOLOFT ) 100 MG tablet [Pharmacy Med Name: SERTRALINE  HCL 100 MG TABLET] 90 tablet 2    Sig: TAKE 1 TABLET BY MOUTH EVERY DAY     Psychiatry:  Antidepressants - SSRI - sertraline  Passed - 12/20/2023  1:32 PM      Passed - AST in normal range and within 360 days    AST  Date Value Ref Range Status  10/30/2023 30 0 - 40 IU/L Final         Passed - ALT in normal range and within 360 days    ALT  Date Value Ref Range Status  10/30/2023 21 0 - 44 IU/L Final         Passed - Completed PHQ-2 or PHQ-9 in the last 360 days      Passed - Valid encounter within last 6 months    Recent Outpatient Visits           1 month ago Healthcare maintenance   [facility] Primary Care & Sports Medicine at [facility], Dessie Flow, MD       Future Appointments             In 10 months Augustus Ledger, Dessie Flow, MD [facility] Health Primary Care & Sports Medicine at [facility], [facility]

## 2024-11-02 ENCOUNTER — Encounter: Payer: BC Managed Care – PPO | Admitting: Family Medicine
